# Patient Record
Sex: Male | Born: 1980 | Race: White | Hispanic: No | Smoking: Current every day smoker
Health system: Southern US, Community
[De-identification: ages and names within clinical notes are randomized; demographics above are authoritative.]

## PROBLEM LIST (undated history)

## (undated) DIAGNOSIS — M549 Dorsalgia, unspecified: Secondary | ICD-10-CM

## (undated) DIAGNOSIS — M542 Cervicalgia: Secondary | ICD-10-CM

## (undated) DIAGNOSIS — G8929 Other chronic pain: Secondary | ICD-10-CM

---

## 1999-12-19 ENCOUNTER — Encounter: Payer: Self-pay | Admitting: Internal Medicine

## 1999-12-19 ENCOUNTER — Emergency Department (HOSPITAL_COMMUNITY): Admission: EM | Admit: 1999-12-19 | Discharge: 1999-12-19 | Payer: Self-pay | Admitting: Internal Medicine

## 2002-11-04 ENCOUNTER — Emergency Department (HOSPITAL_COMMUNITY): Admission: EM | Admit: 2002-11-04 | Discharge: 2002-11-04 | Payer: Self-pay

## 2003-09-26 ENCOUNTER — Emergency Department (HOSPITAL_COMMUNITY): Admission: EM | Admit: 2003-09-26 | Discharge: 2003-09-26 | Payer: Self-pay | Admitting: Emergency Medicine

## 2005-07-13 ENCOUNTER — Emergency Department (HOSPITAL_COMMUNITY): Admission: EM | Admit: 2005-07-13 | Discharge: 2005-07-13 | Payer: Self-pay | Admitting: *Deleted

## 2005-09-21 ENCOUNTER — Emergency Department (HOSPITAL_COMMUNITY): Admission: EM | Admit: 2005-09-21 | Discharge: 2005-09-22 | Payer: Self-pay | Admitting: Emergency Medicine

## 2005-11-11 ENCOUNTER — Emergency Department (HOSPITAL_COMMUNITY): Admission: EM | Admit: 2005-11-11 | Discharge: 2005-11-12 | Payer: Self-pay | Admitting: Emergency Medicine

## 2005-11-14 ENCOUNTER — Emergency Department (HOSPITAL_COMMUNITY): Admission: EM | Admit: 2005-11-14 | Discharge: 2005-11-14 | Payer: Self-pay | Admitting: Emergency Medicine

## 2005-12-03 ENCOUNTER — Emergency Department (HOSPITAL_COMMUNITY): Admission: EM | Admit: 2005-12-03 | Discharge: 2005-12-04 | Payer: Self-pay | Admitting: Emergency Medicine

## 2006-01-04 ENCOUNTER — Emergency Department (HOSPITAL_COMMUNITY): Admission: EM | Admit: 2006-01-04 | Discharge: 2006-01-04 | Payer: Self-pay | Admitting: Emergency Medicine

## 2006-01-28 ENCOUNTER — Emergency Department (HOSPITAL_COMMUNITY): Admission: EM | Admit: 2006-01-28 | Discharge: 2006-01-28 | Payer: Self-pay | Admitting: Emergency Medicine

## 2007-02-28 ENCOUNTER — Emergency Department (HOSPITAL_COMMUNITY): Admission: EM | Admit: 2007-02-28 | Discharge: 2007-02-28 | Payer: Self-pay | Admitting: Family Medicine

## 2007-03-21 ENCOUNTER — Emergency Department (HOSPITAL_COMMUNITY): Admission: EM | Admit: 2007-03-21 | Discharge: 2007-03-21 | Payer: Self-pay | Admitting: Family Medicine

## 2007-05-15 ENCOUNTER — Emergency Department (HOSPITAL_COMMUNITY): Admission: EM | Admit: 2007-05-15 | Discharge: 2007-05-15 | Payer: Self-pay | Admitting: Emergency Medicine

## 2007-07-19 ENCOUNTER — Emergency Department (HOSPITAL_COMMUNITY): Admission: EM | Admit: 2007-07-19 | Discharge: 2007-07-19 | Payer: Self-pay | Admitting: Family Medicine

## 2007-09-19 ENCOUNTER — Emergency Department (HOSPITAL_COMMUNITY): Admission: EM | Admit: 2007-09-19 | Discharge: 2007-09-19 | Payer: Self-pay | Admitting: Family Medicine

## 2007-11-09 ENCOUNTER — Emergency Department (HOSPITAL_COMMUNITY): Admission: EM | Admit: 2007-11-09 | Discharge: 2007-11-09 | Payer: Self-pay | Admitting: Emergency Medicine

## 2007-11-10 ENCOUNTER — Inpatient Hospital Stay (HOSPITAL_COMMUNITY): Admission: EM | Admit: 2007-11-10 | Discharge: 2007-11-12 | Payer: Self-pay | Admitting: Emergency Medicine

## 2007-11-17 ENCOUNTER — Emergency Department (HOSPITAL_COMMUNITY): Admission: EM | Admit: 2007-11-17 | Discharge: 2007-11-17 | Payer: Self-pay | Admitting: Emergency Medicine

## 2008-02-17 ENCOUNTER — Emergency Department (HOSPITAL_COMMUNITY): Admission: EM | Admit: 2008-02-17 | Discharge: 2008-02-17 | Payer: Self-pay | Admitting: Emergency Medicine

## 2008-02-17 ENCOUNTER — Emergency Department (HOSPITAL_COMMUNITY): Admission: EM | Admit: 2008-02-17 | Discharge: 2008-02-18 | Payer: Self-pay | Admitting: Emergency Medicine

## 2008-02-19 ENCOUNTER — Emergency Department (HOSPITAL_COMMUNITY): Admission: EM | Admit: 2008-02-19 | Discharge: 2008-02-19 | Payer: Self-pay | Admitting: Emergency Medicine

## 2008-02-21 ENCOUNTER — Emergency Department (HOSPITAL_COMMUNITY): Admission: EM | Admit: 2008-02-21 | Discharge: 2008-02-21 | Payer: Self-pay | Admitting: Emergency Medicine

## 2008-02-24 ENCOUNTER — Emergency Department (HOSPITAL_COMMUNITY): Admission: EM | Admit: 2008-02-24 | Discharge: 2008-02-24 | Payer: Self-pay | Admitting: Emergency Medicine

## 2008-04-07 ENCOUNTER — Emergency Department (HOSPITAL_COMMUNITY): Admission: EM | Admit: 2008-04-07 | Discharge: 2008-04-07 | Payer: Self-pay | Admitting: Emergency Medicine

## 2010-10-14 ENCOUNTER — Emergency Department (HOSPITAL_COMMUNITY)
Admission: EM | Admit: 2010-10-14 | Discharge: 2010-10-14 | Disposition: A | Discharge status: Home / Self Care | Payer: Self-pay | Attending: Emergency Medicine | Admitting: Emergency Medicine

## 2010-10-14 ENCOUNTER — Emergency Department (HOSPITAL_COMMUNITY): Payer: Self-pay

## 2010-10-14 DIAGNOSIS — R42 Dizziness and giddiness: Secondary | ICD-10-CM | POA: Insufficient documentation

## 2010-10-14 DIAGNOSIS — R209 Unspecified disturbances of skin sensation: Secondary | ICD-10-CM | POA: Insufficient documentation

## 2010-10-14 DIAGNOSIS — H532 Diplopia: Secondary | ICD-10-CM | POA: Insufficient documentation

## 2010-10-14 DIAGNOSIS — H538 Other visual disturbances: Secondary | ICD-10-CM | POA: Insufficient documentation

## 2010-10-14 LAB — URINALYSIS, ROUTINE W REFLEX MICROSCOPIC
Glucose, UA: NEGATIVE mg/dL
Ketones, ur: NEGATIVE mg/dL
Nitrite: NEGATIVE
Protein, ur: NEGATIVE mg/dL
Urobilinogen, UA: 0.2 mg/dL (ref 0.0–1.0)
pH: 5.5 (ref 5.0–8.0)

## 2010-10-14 LAB — GLUCOSE, CAPILLARY: Glucose-Capillary: 102 mg/dL — ABNORMAL HIGH (ref 70–99)

## 2010-10-14 LAB — POCT I-STAT, CHEM 8
BUN: 12 mg/dL (ref 6–23)
Glucose, Bld: 114 mg/dL — ABNORMAL HIGH (ref 70–99)
HCT: 35 % — ABNORMAL LOW (ref 39.0–52.0)
Hemoglobin: 11.9 g/dL — ABNORMAL LOW (ref 13.0–17.0)
Potassium: 4.3 mEq/L (ref 3.5–5.1)
Sodium: 138 mEq/L (ref 135–145)

## 2010-10-14 LAB — RAPID URINE DRUG SCREEN, HOSP PERFORMED
Cocaine: NOT DETECTED
Opiates: NOT DETECTED
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
## 2011-04-17 LAB — CBC
HCT: 38.8 — ABNORMAL LOW
HCT: 40.1
HCT: 42.1
Hemoglobin: 13.7
Hemoglobin: 14.2
MCHC: 34
MCHC: 34.4
MCHC: 34.5
MCV: 96.4
Platelets: 208
Platelets: 352
RBC: 4.17 — ABNORMAL LOW
RBC: 4.37
WBC: 6.8
WBC: 7.3

## 2011-04-17 LAB — BASIC METABOLIC PANEL
BUN: 9
CO2: 27
Calcium: 8.5
Chloride: 104
Creatinine, Ser: 0.83
GFR calc non Af Amer: >60
Glucose, Bld: 90
Potassium: 4

## 2011-04-17 LAB — POCT I-STAT, CHEM 8
BUN: 11
Calcium, Ion: 1.11 — ABNORMAL LOW
Creatinine, Ser: 1.2
Hemoglobin: 15.3
Potassium: 4

## 2011-04-17 LAB — DIFFERENTIAL
Basophils Relative: 1
Eosinophils Absolute: 0.1
Lymphs Abs: 2.9
Neutrophils Relative %: 72

## 2011-04-20 LAB — WOUND CULTURE

## 2011-05-04 LAB — POCT RAPID STREP A: Streptococcus, Group A Screen (Direct): NEGATIVE

## 2012-07-27 ENCOUNTER — Emergency Department (HOSPITAL_COMMUNITY)
Admission: EM | Admit: 2012-07-27 | Discharge: 2012-07-27 | Disposition: A | Discharge status: Home / Self Care | Payer: Self-pay | Attending: Emergency Medicine | Admitting: Emergency Medicine

## 2012-07-27 ENCOUNTER — Encounter (HOSPITAL_COMMUNITY): Payer: Self-pay | Admitting: Adult Health

## 2012-07-27 DIAGNOSIS — K089 Disorder of teeth and supporting structures, unspecified: Secondary | ICD-10-CM | POA: Insufficient documentation

## 2012-07-27 DIAGNOSIS — K0381 Cracked tooth: Secondary | ICD-10-CM | POA: Insufficient documentation

## 2012-07-27 DIAGNOSIS — K0889 Other specified disorders of teeth and supporting structures: Secondary | ICD-10-CM

## 2012-07-27 DIAGNOSIS — R51 Headache: Secondary | ICD-10-CM | POA: Insufficient documentation

## 2012-07-27 DIAGNOSIS — S025XXA Fracture of tooth (traumatic), initial encounter for closed fracture: Secondary | ICD-10-CM

## 2012-07-27 DIAGNOSIS — F17200 Nicotine dependence, unspecified, uncomplicated: Secondary | ICD-10-CM | POA: Insufficient documentation

## 2012-07-27 MED ORDER — IBUPROFEN 800 MG PO TABS: 800.0000 mg | ORAL_TABLET | Freq: Three times a day (TID) | ORAL | Status: DC

## 2012-07-27 MED ORDER — HYDROCODONE-ACETAMINOPHEN 5-325 MG PO TABS: 1.0000 | ORAL_TABLET | Freq: Four times a day (QID) | ORAL | Status: DC | PRN

## 2012-07-27 NOTE — ED Provider Notes (Signed)
History   This chart was scribed for Jones Skene, MD by Melba Coon, ED Scribe. The patient was seen in room TR06C/TR06C and the patient's care was started at 7:20PM.    CSN: [id number]  Arrival date & time 07/27/12  1726   None     Chief Complaint  Patient presents with  . Dental Pain    (Consider location/radiation/quality/duration/timing/severity/associated sxs/prior treatment) The history is provided by the patient. No language interpreter was used.   Erik Kelley is a 32 y.o. male who presents to the Emergency Department complaining of constant, moderate to severe, sharp, right lower dental pain and right-sided headache with an onset 4 days ago. He reports spontaneous fracturing of his tooth around onset and has had difficulty with chewing. He has not seen a dentist due to waiting on his dental insurance to kick in. Ibuprofen has not alleviated the pain. He reports fever at home. Denies neck pain, sore throat, rash, back pain, CP, SOB, abdominal pain, nausea, emesis, diarrhea, dysuria, or extremity pain, edema, weakness, numbness, or tingling. No known allergies. No other pertinent medical symptoms.  History reviewed. No pertinent past medical history.  History reviewed. No pertinent past surgical history.  History reviewed. No pertinent family history.  History  Substance Use Topics  . Smoking status: Current Every Day Smoker    Types: Cigarettes  . Smokeless tobacco: Not on file  . Alcohol Use: No      Review of Systems 10 Systems reviewed and all are negative for acute change except as noted in the HPI.   Allergies  Review of patient's allergies indicates no known allergies.  Home Medications   Current Outpatient Rx  Name  Route  Sig  Dispense  Refill  . IBUPROFEN 200 MG PO TABS   Oral   Take 400-800 mg by mouth every 6 (six) hours as needed. For pain           BP 138/90  Pulse 94  Temp 98.8 F (37.1 C) (Oral)  Resp 20  SpO2  98%  Physical Exam  Nursing notes reviewed.  Electronic medical record reviewed. VITAL SIGNS:   Filed Vitals:   07/27/12 1731  BP: 138/90  Pulse: 94  Temp: 98.8 F (37.1 C)  TempSrc: Oral  Resp: 20  SpO2: 98%   CONSTITUTIONAL: Awake, oriented, appears non-toxic HENT: Atraumatic, normocephalic, oral mucosa pink and moist, airway patent. Buccal aspect of #31 broken exposing brown pulp.  #32 erupting. No gingival erythema or pus. Nares patent without drainage. External ears normal. EYES: Conjunctiva clear, EOMI, PERRLA NECK: Trachea midline, non-tender, supple CARDIOVASCULAR: Normal heart rate, Normal rhythm, No murmurs, rubs, gallops PULMONARY/CHEST: Clear to auscultation, no rhonchi, wheezes, or rales. Symmetrical breath sounds. Non-tender. ABDOMINAL: Non-distended, soft, non-tender - no rebound or guarding.  BS normal. NEUROLOGIC: Non-focal, moving all four extremities, no gross sensory or motor deficits. EXTREMITIES: No clubbing, cyanosis, or edema SKIN: Warm, Dry, No erythema, No rash  ED Course  Dental Performed by: Jones Skene Authorized by: Jones Skene Consent: Verbal consent obtained. Consent given by: patient Patient identity confirmed: verbally with patient Local anesthesia used: yes Anesthesia: local infiltration Local anesthetic: bupivacaine 0.5% without epinephrine Anesthetic total: 2.5 ml Patient sedated: no Patient tolerance: Patient tolerated the procedure well with no immediate complications. Comments: Inferior alveolar block placed with good results   (including critical care time)  COORDINATION OF CARE:  7:22PM - pain injection at the site of the dental fracture will be ordered for Erik Kelley.  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 He will be ready for d/c after pain injection is given.    Labs Reviewed - No data to display No results found.   1. Pain, dental   2. Broken tooth       MDM  Erik Kelley is a 32 y.o. male  presents with a broken tooth and tooth pain. Do not think he has an infection - exposed nerve is issue.  Nothing to get epoxy to adhere to - and pt can't get to dentist for a week or more.  Dental block and Pain meds.  No ABx at this time indicated.  I explained the diagnosis and have given explicit precautions to return to the ER including jaw swelling, fever or chills or any other new or worsening symptoms. The patient understands and accepts the medical plan as it's been dictated and I have answered their questions. Discharge instructions concerning home care and prescriptions have been given.  The patient is STABLE and is discharged to home in good condition.         Jones Skene, MD 07/28/12 1233

## 2012-07-27 NOTE — ED Notes (Signed)
Reports bottom back right molar broke off last thursday. Since has been having dental pain and headache. Having difficulty chewing due to pain. Pain is described as sharp.

## 2013-01-20 ENCOUNTER — Emergency Department (HOSPITAL_COMMUNITY): Payer: Self-pay

## 2013-01-20 ENCOUNTER — Encounter (HOSPITAL_COMMUNITY): Payer: Self-pay | Admitting: Emergency Medicine

## 2013-01-20 DIAGNOSIS — S0510XA Contusion of eyeball and orbital tissues, unspecified eye, initial encounter: Secondary | ICD-10-CM | POA: Insufficient documentation

## 2013-01-20 DIAGNOSIS — S0990XA Unspecified injury of head, initial encounter: Secondary | ICD-10-CM | POA: Insufficient documentation

## 2013-01-20 DIAGNOSIS — T7411XA Adult physical abuse, confirmed, initial encounter: Secondary | ICD-10-CM | POA: Insufficient documentation

## 2013-01-20 DIAGNOSIS — F17200 Nicotine dependence, unspecified, uncomplicated: Secondary | ICD-10-CM | POA: Insufficient documentation
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
## 2013-01-21 ENCOUNTER — Emergency Department (HOSPITAL_COMMUNITY)
Admission: EM | Admit: 2013-01-21 | Discharge: 2013-01-21 | Disposition: A | Discharge status: Home / Self Care | Payer: Self-pay | Attending: Emergency Medicine | Admitting: Emergency Medicine

## 2013-01-21 ENCOUNTER — Encounter (HOSPITAL_COMMUNITY): Payer: Self-pay | Admitting: Radiology

## 2013-01-21 ENCOUNTER — Emergency Department (HOSPITAL_COMMUNITY): Payer: Self-pay

## 2013-01-21 DIAGNOSIS — S0512XA Contusion of eyeball and orbital tissues, left eye, initial encounter: Secondary | ICD-10-CM

## 2013-01-21 MED ORDER — OXYCODONE-ACETAMINOPHEN 5-325 MG PO TABS: ORAL_TABLET | ORAL | Status: DC

## 2013-01-21 MED ORDER — FLUORESCEIN SODIUM 1 MG OP STRP
1.0000 | ORAL_STRIP | Freq: Once | OPHTHALMIC | Status: AC
  Administered 2013-01-21: 1 via OPHTHALMIC
  Filled 2013-01-21: qty 1

## 2013-01-21 MED ORDER — OXYCODONE-ACETAMINOPHEN 5-325 MG PO TABS
2.0000 | ORAL_TABLET | Freq: Once | ORAL | Status: AC
  Administered 2013-01-21: 2 via ORAL
  Filled 2013-01-21: qty 2
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
## 2013-12-29 ENCOUNTER — Encounter (HOSPITAL_COMMUNITY): Payer: Self-pay | Admitting: Emergency Medicine

## 2013-12-29 ENCOUNTER — Emergency Department (HOSPITAL_COMMUNITY)
Admission: EM | Admit: 2013-12-29 | Discharge: 2013-12-29 | Disposition: A | Discharge status: Home / Self Care | Payer: BC Managed Care – PPO | Attending: Emergency Medicine | Admitting: Emergency Medicine

## 2013-12-29 ENCOUNTER — Emergency Department (HOSPITAL_COMMUNITY): Payer: BC Managed Care – PPO

## 2013-12-29 DIAGNOSIS — Y929 Unspecified place or not applicable: Secondary | ICD-10-CM | POA: Insufficient documentation

## 2013-12-29 DIAGNOSIS — X500XXA Overexertion from strenuous movement or load, initial encounter: Secondary | ICD-10-CM | POA: Insufficient documentation

## 2013-12-29 DIAGNOSIS — F17200 Nicotine dependence, unspecified, uncomplicated: Secondary | ICD-10-CM | POA: Insufficient documentation

## 2013-12-29 DIAGNOSIS — S92501A Displaced unspecified fracture of right lesser toe(s), initial encounter for closed fracture: Secondary | ICD-10-CM

## 2013-12-29 DIAGNOSIS — S92919A Unspecified fracture of unspecified toe(s), initial encounter for closed fracture: Secondary | ICD-10-CM | POA: Insufficient documentation

## 2013-12-29 DIAGNOSIS — Y9389 Activity, other specified: Secondary | ICD-10-CM | POA: Insufficient documentation

## 2013-12-29 MED ORDER — IBUPROFEN 400 MG PO TABS
800.0000 mg | ORAL_TABLET | Freq: Once | ORAL | Status: AC
  Administered 2013-12-29: 800 mg via ORAL
  Filled 2013-12-29: qty 2

## 2013-12-29 MED ORDER — TRAMADOL HCL 50 MG PO TABS: 50.0000 mg | ORAL_TABLET | Freq: Four times a day (QID) | ORAL | Status: DC | PRN

## 2013-12-29 NOTE — ED Provider Notes (Signed)
CSN: [id number]     Arrival date & time 12/29/13  1715 History   First MD Initiated Contact with Patient 12/29/13 1842     This chart was scribed for non-physician practitioner, Wynetta Emery  PA-C working with Audree Camel, MD by Arlan Organ, ED Scribe. This patient was seen in room TR11C/TR11C and the patient's care was started at 7:31 PM.   Chief Complaint  Patient presents with  . Toe Injury   HPI  HPI Comments: Erik Kelley is a 33 y.o. male who presents to the Emergency Department complaining of constant, moderate R 5th toe pain x 1 day that is unchanged. He has also noted some swelling to the toe. Pt states he was getting out of bed and inverted his toe while stepping on his dog step. Currently he rates his pain 5/10 without palpation. He states this pain is exacerbated with ambulation, weight bearing, and palpation. No alleviating factors. At this time she denies any fever, chills, numbness, loss of sensation, or paresthesia. No known allergies to medications. He has no pertinent past medical history. No other concerns this visit.  History reviewed. No pertinent past medical history. History reviewed. No pertinent past surgical history. History reviewed. No pertinent family history. History  Substance Use Topics  . Smoking status: Current Every Day Smoker    Types: Cigarettes  . Smokeless tobacco: Not on file  . Alcohol Use: No    Review of Systems  Constitutional: Negative for fever and chills.  HENT: Negative for congestion.   Eyes: Negative for redness.  Respiratory: Negative for cough.   Musculoskeletal: Positive for arthralgias (R 5th toe).  Skin: Negative for rash.  Neurological: Negative for weakness and numbness.  Psychiatric/Behavioral: Negative for confusion.      Allergies  Review of patient's allergies indicates no known allergies.  Home Medications   Prior to Admission medications   Medication Sig Start Date End Date Taking? Authorizing  Provider  traMADol (ULTRAM) 50 MG tablet Take 1 tablet (50 mg total) by mouth every 6 (six) hours as needed. 12/29/13   Ziv Welchel, PA-C   Triage Vitals: BP 139/93  Pulse 80  Temp(Src) 98.5 F (36.9 C) (Oral)  Resp 18  SpO2 98%   Physical Exam  Nursing note and vitals reviewed. Constitutional: He is oriented to person, place, and time. He appears well-developed and well-nourished. No distress.  HENT:  Head: Normocephalic.  Eyes: Conjunctivae and EOM are normal.  Cardiovascular: Normal rate.   Pulmonary/Chest: Effort normal. No stridor.  Musculoskeletal: Normal range of motion.  Right fifth toe with ecchymoses, swelling and tenderness to palpation, there is no overlying skin changes. Sensation is intact. Reduced range of motion  Neurological: He is alert and oriented to person, place, and time.  Psychiatric: He has a normal mood and affect.    ED Course  Procedures (including critical care time)  DIAGNOSTIC STUDIES: Oxygen Saturation is 100% on RA, Normal by my interpretation.    COORDINATION OF CARE: 8:29 PM-Discussed treatment plan with pt at bedside and pt agreed to plan.     Labs Review Labs Reviewed - No data to display  Imaging Review Dg Toe 5th Right  12/29/2013   CLINICAL DATA:  Stubbed toe, pain.  EXAM: RIGHT FIFTH TOE  COMPARISON:  None.  FINDINGS: There is a nondisplaced transverse fracture across the middle phalanx of the fifth toe seen best on the lateral view. The DIP joint is fused. Soft tissue swelling is present. No dislocation.  IMPRESSION: Fracture.   Electronically Signed   By: Davonna BellingJohn  Curnes M.D.   On: 12/29/2013 18:50     EKG Interpretation None      MDM   Final diagnoses:  Fracture of fifth toe, right, closed    Filed Vitals:   12/29/13 1728 12/29/13 1936  BP: 137/92 139/93  Pulse: 87 80  Temp: 98.8 F (37.1 C) 98.5 F (36.9 C)  TempSrc: Oral Oral  Resp: 20 18  SpO2: 100% 98%    Medications  ibuprofen (ADVIL,MOTRIN) tablet 800 mg  (800 mg Oral Given 12/29/13 1946)    Erik Kelley is a 33 y.o. male presenting with right small toe pain. X-ray shows fracture. Buddy tape, postop boot and crutches given. Patient advised rest, elevation and ice.  Evaluation does not show pathology that would require ongoing emergent intervention or inpatient treatment. Pt is hemodynamically stable and mentating appropriately. Discussed findings and plan with patient/guardian, who agrees with care plan. All questions answered. Return precautions discussed and outpatient follow up given.   Discharge Medication List as of 12/29/2013  7:34 PM    START taking these medications   Details  traMADol (ULTRAM) 50 MG tablet Take 1 tablet (50 mg total) by mouth every 6 (six) hours as needed., Starting 12/29/2013, Until Discontinued, Print        I personally performed the services described in this documentation, which was scribed in my presence. The recorded information has been reviewed and is accurate.    Wynetta Emeryicole Norm Wray, PA-C 12/29/13 2030
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
## 2013-12-29 NOTE — ED Notes (Signed)
Pt. Stated, i had a step beside my bed for my dogs and I stepped down and hit my rt. Little toe, it hurts to even step down or put any pressure on it.  It hurts all the way my foot.

## 2013-12-29 NOTE — Discharge Instructions (Signed)
Rest, Ice intermittently (in the first 24-48 hours), Gentle compression with an Ace wrap, and elevate (Limb above the level of the heart) °  °Take up to 800mg of ibuprofen (that is usually 4 over the counter pills)  3 times a day for 5 days. Take with food. ° ° For breakthrough pain you may take Tramadol. Do not drink alcohol drive or operate heavy machinery when taking Tramadol. ° °Do not hesitate to return to the Emergency Department for any new, worsening or concerning symptoms.  ° °If you do not have a primary care doctor you can establish one at the  ° °[facility]: °[address] °[place] [address] °[phone] ° °After you establish care. Let them know you were seen in the emergency room. They must obtain records for further management.  ° ° ° °

## 2013-12-30 NOTE — ED Provider Notes (Signed)
Medical screening examination/treatment/procedure(s) were performed by non-physician practitioner and as supervising physician I was immediately available for consultation/collaboration.   EKG Interpretation None        Audree Camel, MD 12/30/13 [phone]

## 2015-02-12 ENCOUNTER — Encounter (HOSPITAL_COMMUNITY): Payer: Self-pay

## 2015-02-12 ENCOUNTER — Emergency Department (HOSPITAL_COMMUNITY)
Admission: EM | Admit: 2015-02-12 | Discharge: 2015-02-12 | Disposition: A | Discharge status: Home / Self Care | Payer: No Typology Code available for payment source | Attending: Emergency Medicine | Admitting: Emergency Medicine

## 2015-02-12 ENCOUNTER — Emergency Department (HOSPITAL_COMMUNITY): Payer: No Typology Code available for payment source

## 2015-02-12 DIAGNOSIS — K029 Dental caries, unspecified: Secondary | ICD-10-CM | POA: Insufficient documentation

## 2015-02-12 DIAGNOSIS — S59901A Unspecified injury of right elbow, initial encounter: Secondary | ICD-10-CM | POA: Diagnosis not present

## 2015-02-12 DIAGNOSIS — S0990XA Unspecified injury of head, initial encounter: Secondary | ICD-10-CM | POA: Diagnosis present

## 2015-02-12 DIAGNOSIS — Z720 Tobacco use: Secondary | ICD-10-CM | POA: Insufficient documentation

## 2015-02-12 DIAGNOSIS — S4991XA Unspecified injury of right shoulder and upper arm, initial encounter: Secondary | ICD-10-CM | POA: Insufficient documentation

## 2015-02-12 DIAGNOSIS — Y998 Other external cause status: Secondary | ICD-10-CM | POA: Insufficient documentation

## 2015-02-12 DIAGNOSIS — Y92410 Unspecified street and highway as the place of occurrence of the external cause: Secondary | ICD-10-CM | POA: Diagnosis not present

## 2015-02-12 DIAGNOSIS — Y9389 Activity, other specified: Secondary | ICD-10-CM | POA: Diagnosis not present

## 2015-02-12 DIAGNOSIS — G8929 Other chronic pain: Secondary | ICD-10-CM | POA: Diagnosis not present

## 2015-02-12 DIAGNOSIS — S92345A Nondisplaced fracture of fourth metatarsal bone, left foot, initial encounter for closed fracture: Secondary | ICD-10-CM | POA: Insufficient documentation

## 2015-02-12 DIAGNOSIS — S30811A Abrasion of abdominal wall, initial encounter: Secondary | ICD-10-CM | POA: Insufficient documentation

## 2015-02-12 DIAGNOSIS — S0083XA Contusion of other part of head, initial encounter: Secondary | ICD-10-CM | POA: Insufficient documentation

## 2015-02-12 DIAGNOSIS — S92302A Fracture of unspecified metatarsal bone(s), left foot, initial encounter for closed fracture: Secondary | ICD-10-CM

## 2015-02-12 DIAGNOSIS — T148XXA Other injury of unspecified body region, initial encounter: Secondary | ICD-10-CM

## 2015-02-12 HISTORY — DX: Cervicalgia: M54.2

## 2015-02-12 HISTORY — DX: Dorsalgia, unspecified: M54.9

## 2015-02-12 HISTORY — DX: Other chronic pain: G89.29

## 2015-02-12 MED ORDER — MORPHINE SULFATE 4 MG/ML IJ SOLN
4.0000 mg | Freq: Once | INTRAMUSCULAR | Status: AC
  Administered 2015-02-12: 4 mg via INTRAVENOUS
  Filled 2015-02-12: qty 1

## 2015-02-12 MED ORDER — HYDROCODONE-ACETAMINOPHEN 5-325 MG PO TABS: 1.0000 | ORAL_TABLET | ORAL | Status: DC | PRN

## 2015-02-12 MED ORDER — ONDANSETRON HCL 4 MG/2ML IJ SOLN
4.0000 mg | Freq: Once | INTRAMUSCULAR | Status: AC
  Administered 2015-02-12: 4 mg via INTRAVENOUS
  Filled 2015-02-12: qty 2

## 2015-02-12 MED ORDER — HYDROMORPHONE HCL 1 MG/ML IJ SOLN
1.0000 mg | Freq: Once | INTRAMUSCULAR | Status: AC
  Administered 2015-02-12: 1 mg via INTRAVENOUS
  Filled 2015-02-12: qty 1

## 2015-02-12 NOTE — ED Notes (Signed)
Pt refused to update vitals

## 2015-02-12 NOTE — ED Notes (Signed)
Patient transported to X-ray 

## 2015-02-12 NOTE — ED Notes (Signed)
C-collar applied-C spine maintained

## 2015-02-12 NOTE — ED Notes (Signed)
Ortho tech at bedside 
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
## 2015-02-12 NOTE — Progress Notes (Signed)
Orthopedic Tech Progress Note Patient Details:  Erik Kelley [birth year] [id number]  Ortho Devices Type of Ortho Device: Ace wrap, Crutches, Post (short leg) splint Ortho Device/Splint Location: lle Ortho Device/Splint Interventions: Application   [name] 02/12/2015, 11:09 AM
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
## 2015-02-12 NOTE — ED Notes (Signed)
Attempted to perform wound care on pt.  Was able to clean wounds to left arm but pt was not tolerating procedure well.  RN to give pt a break and re attempt in a few minutes.

## 2015-02-12 NOTE — ED Notes (Signed)
Wounds cleaned, bacitracin applied and clean dressings applied.  Pt voiced concern of left pinky finger.  Pinky finger is now swollen and bruised with limited movement.  Cap refill remains <3 seconds.  [place] MD made aware and further orders received.

## 2015-03-12 ENCOUNTER — Emergency Department (HOSPITAL_COMMUNITY): Payer: Self-pay

## 2015-03-12 ENCOUNTER — Encounter (HOSPITAL_COMMUNITY)
Admission: EM | Disposition: A | Discharge status: Home / Self Care | Payer: Self-pay | Source: Home / Self Care | Attending: Orthopedic Surgery

## 2015-03-12 ENCOUNTER — Inpatient Hospital Stay (HOSPITAL_COMMUNITY)
Admission: EM | Admit: 2015-03-12 | Discharge: 2015-03-18 | DRG: 504 | Disposition: A | Discharge status: Home / Self Care | Payer: Self-pay | Attending: Orthopedic Surgery | Admitting: Orthopedic Surgery

## 2015-03-12 ENCOUNTER — Encounter (HOSPITAL_COMMUNITY): Payer: Self-pay | Admitting: *Deleted

## 2015-03-12 ENCOUNTER — Emergency Department (HOSPITAL_COMMUNITY): Payer: Self-pay | Admitting: Anesthesiology

## 2015-03-12 DIAGNOSIS — S93326A Dislocation of tarsometatarsal joint of unspecified foot, initial encounter: Secondary | ICD-10-CM | POA: Diagnosis present

## 2015-03-12 DIAGNOSIS — M549 Dorsalgia, unspecified: Secondary | ICD-10-CM | POA: Diagnosis present

## 2015-03-12 DIAGNOSIS — S52371A Galeazzi's fracture of right radius, initial encounter for closed fracture: Secondary | ICD-10-CM

## 2015-03-12 DIAGNOSIS — G8929 Other chronic pain: Secondary | ICD-10-CM | POA: Diagnosis present

## 2015-03-12 DIAGNOSIS — F17210 Nicotine dependence, cigarettes, uncomplicated: Secondary | ICD-10-CM | POA: Diagnosis present

## 2015-03-12 DIAGNOSIS — G5601 Carpal tunnel syndrome, right upper limb: Secondary | ICD-10-CM | POA: Diagnosis present

## 2015-03-12 DIAGNOSIS — T07XXXA Unspecified multiple injuries, initial encounter: Secondary | ICD-10-CM

## 2015-03-12 DIAGNOSIS — S93325A Dislocation of tarsometatarsal joint of left foot, initial encounter: Secondary | ICD-10-CM

## 2015-03-12 DIAGNOSIS — S52501A Unspecified fracture of the lower end of right radius, initial encounter for closed fracture: Secondary | ICD-10-CM | POA: Diagnosis present

## 2015-03-12 DIAGNOSIS — S92242A Displaced fracture of medial cuneiform of left foot, initial encounter for closed fracture: Principal | ICD-10-CM | POA: Diagnosis present

## 2015-03-12 DIAGNOSIS — S92302A Fracture of unspecified metatarsal bone(s), left foot, initial encounter for closed fracture: Secondary | ICD-10-CM

## 2015-03-12 DIAGNOSIS — S62001A Unspecified fracture of navicular [scaphoid] bone of right wrist, initial encounter for closed fracture: Secondary | ICD-10-CM | POA: Diagnosis present

## 2015-03-12 HISTORY — PX: OPEN REDUCTION INTERNAL FIXATION (ORIF) DISTAL RADIAL FRACTURE: SHX5989

## 2015-03-12 SURGERY — OPEN REDUCTION INTERNAL FIXATION (ORIF) DISTAL RADIUS FRACTURE
Anesthesia: General | Site: Arm Lower | Laterality: Right

## 2015-03-12 MED ORDER — MEPERIDINE HCL 25 MG/ML IJ SOLN: 6.2500 mg | INTRAMUSCULAR | Status: DC | PRN

## 2015-03-12 MED ORDER — HYDROMORPHONE HCL 1 MG/ML IJ SOLN
1.0000 mg | Freq: Once | INTRAMUSCULAR | Status: AC
  Administered 2015-03-12: 1 mg via INTRAVENOUS
  Filled 2015-03-12: qty 1

## 2015-03-12 MED ORDER — HYDROMORPHONE HCL 1 MG/ML IJ SOLN
INTRAMUSCULAR | Status: AC
  Filled 2015-03-12: qty 1

## 2015-03-12 MED ORDER — ONDANSETRON HCL 4 MG/2ML IJ SOLN
4.0000 mg | Freq: Once | INTRAMUSCULAR | Status: AC
  Administered 2015-03-12: 4 mg via INTRAVENOUS
  Filled 2015-03-12: qty 2

## 2015-03-12 MED ORDER — DIPHENHYDRAMINE HCL 50 MG/ML IJ SOLN
INTRAMUSCULAR | Status: DC | PRN
  Administered 2015-03-12: 25 mg via INTRAVENOUS

## 2015-03-12 MED ORDER — BUPIVACAINE-EPINEPHRINE (PF) 0.5% -1:200000 IJ SOLN
INTRAMUSCULAR | Status: DC | PRN
  Administered 2015-03-12: 20 mL via PERINEURAL

## 2015-03-12 MED ORDER — MIDAZOLAM HCL 2 MG/2ML IJ SOLN
INTRAMUSCULAR | Status: AC
  Filled 2015-03-12: qty 4

## 2015-03-12 MED ORDER — SUCCINYLCHOLINE CHLORIDE 20 MG/ML IJ SOLN
INTRAMUSCULAR | Status: AC
  Filled 2015-03-12: qty 1

## 2015-03-12 MED ORDER — METHOCARBAMOL 1000 MG/10ML IJ SOLN
500.0000 mg | Freq: Four times a day (QID) | INTRAVENOUS | Status: DC | PRN
  Filled 2015-03-12: qty 5

## 2015-03-12 MED ORDER — POTASSIUM CHLORIDE IN NACL 20-0.45 MEQ/L-% IV SOLN
INTRAVENOUS | Status: DC
  Administered 2015-03-13 – 2015-03-15 (×4): via INTRAVENOUS
  Filled 2015-03-12 (×12): qty 1000

## 2015-03-12 MED ORDER — LACTATED RINGERS IV SOLN: INTRAVENOUS | Status: DC

## 2015-03-12 MED ORDER — CEFAZOLIN SODIUM-DEXTROSE 2-3 GM-% IV SOLR
INTRAVENOUS | Status: AC
  Filled 2015-03-12: qty 50

## 2015-03-12 MED ORDER — EPHEDRINE SULFATE 50 MG/ML IJ SOLN
INTRAMUSCULAR | Status: AC
  Filled 2015-03-12: qty 1

## 2015-03-12 MED ORDER — 0.9 % SODIUM CHLORIDE (POUR BTL) OPTIME
TOPICAL | Status: DC | PRN
  Administered 2015-03-12: 1000 mL

## 2015-03-12 MED ORDER — LIDOCAINE HCL (CARDIAC) 20 MG/ML IV SOLN
INTRAVENOUS | Status: AC
  Filled 2015-03-12: qty 10

## 2015-03-12 MED ORDER — FENTANYL CITRATE (PF) 250 MCG/5ML IJ SOLN
INTRAMUSCULAR | Status: AC
  Filled 2015-03-12: qty 5

## 2015-03-12 MED ORDER — LIDOCAINE HCL (CARDIAC) 20 MG/ML IV SOLN
INTRAVENOUS | Status: DC | PRN
  Administered 2015-03-12: 80 mg via INTRAVENOUS

## 2015-03-12 MED ORDER — LACTATED RINGERS IV SOLN
INTRAVENOUS | Status: DC | PRN
  Administered 2015-03-12 (×2): via INTRAVENOUS

## 2015-03-12 MED ORDER — METHOCARBAMOL 500 MG PO TABS
500.0000 mg | ORAL_TABLET | Freq: Four times a day (QID) | ORAL | Status: DC | PRN
  Administered 2015-03-13 – 2015-03-16 (×7): 500 mg via ORAL
  Filled 2015-03-12 (×12): qty 1

## 2015-03-12 MED ORDER — HYDROMORPHONE HCL 1 MG/ML IJ SOLN
1.0000 mg | INTRAMUSCULAR | Status: DC | PRN
  Administered 2015-03-12 – 2015-03-17 (×8): 1 mg via INTRAVENOUS
  Filled 2015-03-12 (×13): qty 1

## 2015-03-12 MED ORDER — ONDANSETRON HCL 4 MG/2ML IJ SOLN
INTRAMUSCULAR | Status: DC | PRN
  Administered 2015-03-12: 4 mg via INTRAVENOUS

## 2015-03-12 MED ORDER — MIDAZOLAM HCL 5 MG/5ML IJ SOLN
INTRAMUSCULAR | Status: DC | PRN
  Administered 2015-03-12: 2 mg via INTRAVENOUS

## 2015-03-12 MED ORDER — DIPHENHYDRAMINE HCL 50 MG/ML IJ SOLN
INTRAMUSCULAR | Status: AC
  Filled 2015-03-12: qty 1

## 2015-03-12 MED ORDER — PROMETHAZINE HCL 25 MG/ML IJ SOLN: 6.2500 mg | INTRAMUSCULAR | Status: DC | PRN

## 2015-03-12 MED ORDER — ROCURONIUM BROMIDE 50 MG/5ML IV SOLN
INTRAVENOUS | Status: AC
  Filled 2015-03-12: qty 2

## 2015-03-12 MED ORDER — HYDROMORPHONE HCL 1 MG/ML IJ SOLN
0.2500 mg | INTRAMUSCULAR | Status: DC | PRN
  Administered 2015-03-12 (×2): 0.5 mg via INTRAVENOUS

## 2015-03-12 MED ORDER — FENTANYL CITRATE (PF) 250 MCG/5ML IJ SOLN
INTRAMUSCULAR | Status: DC | PRN
  Administered 2015-03-12: 100 ug via INTRAVENOUS
  Administered 2015-03-12 (×3): 50 ug via INTRAVENOUS

## 2015-03-12 MED ORDER — ONDANSETRON HCL 4 MG/2ML IJ SOLN
INTRAMUSCULAR | Status: AC
  Filled 2015-03-12: qty 2

## 2015-03-12 MED ORDER — SODIUM CHLORIDE 0.9 % IV BOLUS (SEPSIS)
1000.0000 mL | Freq: Once | INTRAVENOUS | Status: AC
  Administered 2015-03-12: 1000 mL via INTRAVENOUS

## 2015-03-12 MED ORDER — PROPOFOL 10 MG/ML IV BOLUS
INTRAVENOUS | Status: DC | PRN
  Administered 2015-03-12: 160 mg via INTRAVENOUS

## 2015-03-12 MED ORDER — PROPOFOL 10 MG/ML IV BOLUS
INTRAVENOUS | Status: AC
  Filled 2015-03-12: qty 20

## 2015-03-12 MED ORDER — ARTIFICIAL TEARS OP OINT
TOPICAL_OINTMENT | OPHTHALMIC | Status: AC
  Filled 2015-03-12: qty 3.5

## 2015-03-12 MED ORDER — CEFAZOLIN SODIUM-DEXTROSE 2-3 GM-% IV SOLR
INTRAVENOUS | Status: DC | PRN
  Administered 2015-03-12: 2 g via INTRAVENOUS

## 2015-03-12 MED ORDER — SUCCINYLCHOLINE CHLORIDE 20 MG/ML IJ SOLN
INTRAMUSCULAR | Status: DC | PRN
  Administered 2015-03-12: 120 mg via INTRAVENOUS

## 2015-03-12 SURGICAL SUPPLY — 68 items
APL SKNCLS STERI-STRIP NONHPOA (GAUZE/BANDAGES/DRESSINGS) ×1
BANDAGE ELASTIC 3 VELCRO ST LF (GAUZE/BANDAGES/DRESSINGS) ×2 IMPLANT
BANDAGE ELASTIC 4 VELCRO ST LF (GAUZE/BANDAGES/DRESSINGS) ×2 IMPLANT
BANDAGE ELASTIC 6 VELCRO ST LF (GAUZE/BANDAGES/DRESSINGS) ×1 IMPLANT
BENZOIN TINCTURE PRP APPL 2/3 (GAUZE/BANDAGES/DRESSINGS) ×1 IMPLANT
BIT DRILL 2 FAST STEP (BIT) ×1 IMPLANT
BIT DRILL 2.2 CANN STRGHT (BIT) ×1 IMPLANT
BIT DRILL 2.5X4 QC (BIT) ×1 IMPLANT
BNDG CMPR 9X4 STRL LF SNTH (GAUZE/BANDAGES/DRESSINGS) ×1
BNDG ESMARK 4X9 LF (GAUZE/BANDAGES/DRESSINGS) ×2 IMPLANT
BNDG GAUZE ELAST 4 BULKY (GAUZE/BANDAGES/DRESSINGS) ×4 IMPLANT
CLSR STERI-STRIP ANTIMIC 1/2X4 (GAUZE/BANDAGES/DRESSINGS) ×1 IMPLANT
CORDS BIPOLAR (ELECTRODE) ×1 IMPLANT
COVER SURGICAL LIGHT HANDLE (MISCELLANEOUS) ×2 IMPLANT
CUFF TOURNIQUET SINGLE 18IN (TOURNIQUET CUFF) ×2 IMPLANT
DRAPE OEC MINIVIEW 54X84 (DRAPES) ×1 IMPLANT
DRAPE SURG 17X23 STRL (DRAPES) ×2 IMPLANT
DRSG PAD ABDOMINAL 8X10 ST (GAUZE/BANDAGES/DRESSINGS) ×1 IMPLANT
DURAPREP 26ML APPLICATOR (WOUND CARE) ×2 IMPLANT
ELECT REM PT RETURN 9FT ADLT (ELECTROSURGICAL)
ELECTRODE REM PT RTRN 9FT ADLT (ELECTROSURGICAL) IMPLANT
GAUZE SPONGE 4X4 12PLY STRL (GAUZE/BANDAGES/DRESSINGS) ×2 IMPLANT
GAUZE XEROFORM 5X9 LF (GAUZE/BANDAGES/DRESSINGS) ×1 IMPLANT
GLOVE SURG SYN 8.0 (GLOVE) ×2 IMPLANT
GLOVE SURG SYN 8.0 PF PI (GLOVE) ×1 IMPLANT
GOWN STRL REUS W/ TWL LRG LVL3 (GOWN DISPOSABLE) ×1 IMPLANT
GOWN STRL REUS W/ TWL XL LVL3 (GOWN DISPOSABLE) ×1 IMPLANT
GOWN STRL REUS W/TWL LRG LVL3 (GOWN DISPOSABLE) ×2
GOWN STRL REUS W/TWL XL LVL3 (GOWN DISPOSABLE) ×2
GUIDEWIRE .045XTROC TIP LSR LN (WIRE) IMPLANT
K-WIRE 1.1 (WIRE) ×2
KIT BASIN OR (CUSTOM PROCEDURE TRAY) ×2 IMPLANT
KIT ROOM TURNOVER OR (KITS) ×2 IMPLANT
MANIFOLD NEPTUNE II (INSTRUMENTS) ×2 IMPLANT
NDL HYPO 25GX1X1/2 BEV (NEEDLE) IMPLANT
NEEDLE HYPO 25GX1X1/2 BEV (NEEDLE) IMPLANT
NS IRRIG 1000ML POUR BTL (IV SOLUTION) ×2 IMPLANT
PACK ORTHO EXTREMITY (CUSTOM PROCEDURE TRAY) ×2 IMPLANT
PAD ARMBOARD 7.5X6 YLW CONV (MISCELLANEOUS) ×4 IMPLANT
PAD CAST 3X4 CTTN HI CHSV (CAST SUPPLIES) ×1 IMPLANT
PAD CAST 4YDX4 CTTN HI CHSV (CAST SUPPLIES) ×1 IMPLANT
PADDING CAST COTTON 3X4 STRL (CAST SUPPLIES) ×2
PADDING CAST COTTON 4X4 STRL (CAST SUPPLIES) ×2
PEG SUBCHONDRAL SMOOTH 2.0X22 (Peg) ×3 IMPLANT
PEG SUBCHONDRAL SMOOTH 2.0X24 (Peg) ×4 IMPLANT
PENCIL BUTTON HOLSTER BLD 10FT (ELECTRODE) IMPLANT
PLATE STAN 24.4X59.5 RT (Plate) ×1 IMPLANT
SCREW BN 12X3.5XNS CORT TI (Screw) IMPLANT
SCREW COMPRESSION 3.5X20 (Screw) ×1 IMPLANT
SCREW CORT 3.5X12 (Screw) ×2 IMPLANT
SCREW CORT 3.5X14 LNG (Screw) ×1 IMPLANT
SCREW CORT 3.5X15 (Screw) ×1 IMPLANT
SCREW CORT 3.5X16 LNG (Screw) ×1 IMPLANT
SPLINT PLASTER EXTRA FAST 3X15 (CAST SUPPLIES) ×1
SPLINT PLASTER GYPS XFAST 3X15 (CAST SUPPLIES) IMPLANT
SPONGE LAP 4X18 X RAY DECT (DISPOSABLE) IMPLANT
SPONGE SCRUB IODOPHOR (GAUZE/BANDAGES/DRESSINGS) ×2 IMPLANT
STRIP CLOSURE SKIN 1/2X4 (GAUZE/BANDAGES/DRESSINGS) ×2 IMPLANT
SUT PROLENE 3 0 PS 2 (SUTURE) ×1 IMPLANT
SUT VIC AB 2-0 CT3 27 (SUTURE) ×1 IMPLANT
SUT VIC AB 3-0 FS2 27 (SUTURE) IMPLANT
SUT VICRYL 4-0 PS2 18IN ABS (SUTURE) ×1 IMPLANT
SYR CONTROL 10ML LL (SYRINGE) IMPLANT
TOWEL OR 17X24 6PK STRL BLUE (TOWEL DISPOSABLE) ×2 IMPLANT
TOWEL OR 17X26 10 PK STRL BLUE (TOWEL DISPOSABLE) ×2 IMPLANT
TUBE CONNECTING 12X1/4 (SUCTIONS) IMPLANT
UNDERPAD 30X30 INCONTINENT (UNDERPADS AND DIAPERS) ×2 IMPLANT
WATER STERILE IRR 1000ML POUR (IV SOLUTION) ×2 IMPLANT

## 2015-03-12 NOTE — Progress Notes (Signed)
Orthopedic Tech Progress Note Patient Details:  Erik Kelley 1981-06-10 [id number]  Ortho Devices Type of Ortho Device: Ace wrap, Sugartong splint Ortho Device/Splint Location: RUE Ortho Device/Splint Interventions: Ordered, Application   Jennye Moccasin 03/12/2015, 7:44 PM
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
## 2015-03-12 NOTE — ED Provider Notes (Signed)
Imaging returns and patient has a Lisfranc dislocation of the left foot with accompanying fractures that are closed and neurovascularly intact as well as a fracture of the right radius. Spoke with Dr. Mina Marble and Dr. Dion Saucier who evaluated the images and feel that the patient will need surgical repair. Patient has been fasting since 8 AM this morning. After Dion Saucier will come and evaluate because most likely patient will be admitted for repair tomorrow. Sugar tong splint was placed on the right forearm  Gwyneth Sprout, MD 03/13/15 0021
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
## 2015-03-12 NOTE — Anesthesia Preprocedure Evaluation (Addendum)
Anesthesia Evaluation  Patient identified by MRN, date of birth, ID band Patient awake    Reviewed: Allergy & Precautions, NPO status , Patient's Chart, lab work & pertinent test results  Airway Mallampati: I  TM Distance: >3 FB Neck ROM: Full    Dental  (+) Teeth Intact, Poor Dentition, Dental Advisory Given   Pulmonary Current Smoker,  + rhonchi         Cardiovascular negative cardio ROS  Rhythm:Regular Rate:Normal     Neuro/Psych negative neurological ROS  negative psych ROS   GI/Hepatic negative GI ROS, Neg liver ROS,   Endo/Other  negative endocrine ROS  Renal/GU negative Renal ROS  negative genitourinary   Musculoskeletal negative musculoskeletal ROS (+)   Abdominal   Peds negative pediatric ROS (+)  Hematology negative hematology ROS (+)   Anesthesia Other Findings   Reproductive/Obstetrics negative OB ROS                           Anesthesia Physical Anesthesia Plan  ASA: I and emergent  Anesthesia Plan: General   Post-op Pain Management:    Induction: Intravenous, Rapid sequence and Cricoid pressure planned  Airway Management Planned: Oral ETT  Additional Equipment:   Intra-op Plan:   Post-operative Plan: Extubation in OR  Informed Consent: I have reviewed the patients History and Physical, chart, labs and discussed the procedure including the risks, benefits and alternatives for the proposed anesthesia with the patient or authorized representative who has indicated his/her understanding and acceptance.   Dental advisory given  Plan Discussed with: CRNA, Anesthesiologist and Surgeon  Anesthesia Plan Comments:        Anesthesia Quick Evaluation

## 2015-03-12 NOTE — ED Notes (Signed)
Ortho Tech has been notified. 1915

## 2015-03-12 NOTE — ED Notes (Signed)
Mother and family taking home belongings

## 2015-03-12 NOTE — Transfer of Care (Signed)
Immediate Anesthesia Transfer of Care Note  Patient: Erik Kelley  Procedure(s) Performed: Procedure(s): OPEN REDUCTION INTERNAL FIXATION (ORIF) RIGHT DISTAL RADIAL AND SCAPHOID FRACTURES AND CARPAL TUNNEL RELEASE, LEFT FOOT SPLINT  APPLICATION. (Right)  Patient Location: PACU  Anesthesia Type:GA combined with regional for post-op pain  Level of Consciousness: sedated  Airway & Oxygen Therapy: Patient Spontanous Breathing and Patient connected to face mask oxygen  Post-op Assessment: Report given to RN and Post -op Vital signs reviewed and stable  Post vital signs: Reviewed and stable  Last Vitals:  Filed Vitals:   03/12/15 1930  BP: 136/86  Pulse: 106  Temp:   Resp: 12    Complications: No apparent anesthesia complications

## 2015-03-12 NOTE — ED Notes (Signed)
Ortho paged. 

## 2015-03-12 NOTE — Op Note (Signed)
See note [phone]

## 2015-03-12 NOTE — Consult Note (Signed)
  Patient with displaced right distal radius fracture and scaphoid fracture  Will have ED splint and proceed with ORIF above tomorrow with Dr Dion Saucier caring for left foot fracture/dislocation
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
## 2015-03-12 NOTE — Anesthesia Procedure Notes (Addendum)
Procedure Name: Intubation Date/Time: 03/12/2015 9:41 PM Performed by: Brien Mates D Pre-anesthesia Checklist: Patient identified, Emergency Drugs available, Suction available, Patient being monitored and Timeout performed Patient Re-evaluated:Patient Re-evaluated prior to inductionOxygen Delivery Method: Circle system utilized Preoxygenation: Pre-oxygenation with 100% oxygen Intubation Type: IV induction, Rapid sequence and Cricoid Pressure applied Laryngoscope Size: Miller and 2 Tube type: Oral Tube size: 7.5 mm Number of attempts: 1 Airway Equipment and Method: Stylet Placement Confirmation: ETT inserted through vocal cords under direct vision,  positive ETCO2 and breath sounds checked- equal and bilateral Secured at: 22 cm Tube secured with: Tape Dental Injury: Teeth and Oropharynx as per pre-operative assessment    Anesthesia Regional Block:  Supraclavicular block  Pre-Anesthetic Checklist: ,, timeout performed, Correct Patient, Correct Site, Correct Laterality, Correct Procedure, Correct Position, site marked, Risks and benefits discussed,  Surgical consent,  Pre-op evaluation,  At surgeon's request and post-op pain management  Laterality: Right  Prep: chloraprep       Needles:  Injection technique: Single-shot  Needle Type: Stimiplex     Needle Length: 5cm 5 cm Needle Gauge: 22 and 22 G    Additional Needles:  Procedures: ultrasound guided (picture in chart) and nerve stimulator Supraclavicular block  Nerve Stimulator or Paresthesia:  Response: biceps flexion,   Additional Responses:   Narrative:  Start time: 03/12/2015 9:15 PM End time: 03/12/2015 9:20 PM Injection made incrementally with aspirations every 5 mL.  Performed by: Personally  Anesthesiologist: Cyndie Chime  Additional Notes: Functioning IV was confirmed and monitors were applied.  A 50mm 22ga Stimuplex needle was used. Sterile prep and drape,hand hygiene and sterile gloves were  used.  Negative aspiration and negative test dose prior to incremental administration of local anesthetic. The patient tolerated the procedure well.
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
## 2015-03-13 ENCOUNTER — Encounter (HOSPITAL_COMMUNITY): Payer: Self-pay | Admitting: Orthopedic Surgery

## 2015-03-13 LAB — CBC
HEMATOCRIT: 38 % — AB (ref 39.0–52.0)
HEMOGLOBIN: 13.3 g/dL (ref 13.0–17.0)
MCH: 32 pg (ref 26.0–34.0)
MCHC: 35 g/dL (ref 30.0–36.0)
MCV: 91.6 fL (ref 78.0–100.0)
PLATELETS: 282 10*3/uL (ref 150–400)
RBC: 4.15 MIL/uL — AB (ref 4.22–5.81)
RDW: 12.6 % (ref 11.5–15.5)
WBC: 17.3 10*3/uL — AB (ref 4.0–10.5)

## 2015-03-13 LAB — SURGICAL PCR SCREEN
MRSA, PCR: NEGATIVE
Staphylococcus aureus: POSITIVE — AB

## 2015-03-13 LAB — CREATININE, SERUM
Creatinine, Ser: 1.08 mg/dL (ref 0.61–1.24)
GFR calc non Af Amer: >60 mL/min (ref >60)

## 2015-03-13 MED ORDER — CEFAZOLIN SODIUM 1-5 GM-% IV SOLN
1.0000 g | Freq: Four times a day (QID) | INTRAVENOUS | Status: AC
  Administered 2015-03-13 (×3): 1 g via INTRAVENOUS
  Filled 2015-03-13 (×3): qty 50

## 2015-03-13 MED ORDER — ONDANSETRON HCL 4 MG/2ML IJ SOLN
4.0000 mg | Freq: Four times a day (QID) | INTRAMUSCULAR | Status: DC | PRN
  Administered 2015-03-15: 4 mg via INTRAVENOUS

## 2015-03-13 MED ORDER — HYDROMORPHONE HCL 1 MG/ML IJ SOLN
1.0000 mg | INTRAMUSCULAR | Status: DC | PRN
  Administered 2015-03-13 – 2015-03-15 (×14): 2 mg via INTRAVENOUS
  Administered 2015-03-15: 1 mg via INTRAVENOUS
  Administered 2015-03-15 – 2015-03-16 (×4): 2 mg via INTRAVENOUS
  Administered 2015-03-16: 1 mg via INTRAVENOUS
  Administered 2015-03-16 (×3): 2 mg via INTRAVENOUS
  Administered 2015-03-16: 1 mg via INTRAVENOUS
  Filled 2015-03-13 (×13): qty 2
  Filled 2015-03-13: qty 1
  Filled 2015-03-13 (×4): qty 2
  Filled 2015-03-13: qty 1
  Filled 2015-03-13 (×4): qty 2

## 2015-03-13 MED ORDER — ENOXAPARIN SODIUM 40 MG/0.4ML ~~LOC~~ SOLN
40.0000 mg | SUBCUTANEOUS | Status: DC
  Administered 2015-03-13 – 2015-03-18 (×5): 40 mg via SUBCUTANEOUS
  Filled 2015-03-13 (×5): qty 0.4

## 2015-03-13 MED ORDER — SENNA 8.6 MG PO TABS
1.0000 | ORAL_TABLET | Freq: Two times a day (BID) | ORAL | Status: DC
  Administered 2015-03-13 – 2015-03-18 (×10): 8.6 mg via ORAL
  Filled 2015-03-13 (×10): qty 1

## 2015-03-13 MED ORDER — OXYCODONE HCL 5 MG PO TABS
5.0000 mg | ORAL_TABLET | ORAL | Status: DC | PRN
  Administered 2015-03-13 – 2015-03-18 (×28): 15 mg via ORAL
  Filled 2015-03-13 (×28): qty 3

## 2015-03-13 MED ORDER — HYDROMORPHONE HCL 1 MG/ML IJ SOLN
1.0000 mg | INTRAMUSCULAR | Status: DC | PRN
  Administered 2015-03-13 (×4): 1 mg via INTRAVENOUS

## 2015-03-13 MED ORDER — DOCUSATE SODIUM 100 MG PO CAPS
100.0000 mg | ORAL_CAPSULE | Freq: Two times a day (BID) | ORAL | Status: DC
  Administered 2015-03-13 – 2015-03-18 (×10): 100 mg via ORAL
  Filled 2015-03-13 (×10): qty 1

## 2015-03-13 MED ORDER — METOCLOPRAMIDE HCL 5 MG/ML IJ SOLN: 5.0000 mg | Freq: Three times a day (TID) | INTRAMUSCULAR | Status: DC | PRN

## 2015-03-13 MED ORDER — BISACODYL 10 MG RE SUPP
10.0000 mg | Freq: Every day | RECTAL | Status: DC | PRN
  Administered 2015-03-16: 10 mg via RECTAL
  Filled 2015-03-13: qty 1

## 2015-03-13 MED ORDER — MAGNESIUM CITRATE PO SOLN: 1.0000 | Freq: Once | ORAL | Status: DC | PRN

## 2015-03-13 MED ORDER — OXYCODONE HCL 5 MG PO TABS
5.0000 mg | ORAL_TABLET | ORAL | Status: DC | PRN
  Administered 2015-03-13 (×2): 10 mg via ORAL
  Filled 2015-03-13 (×2): qty 2

## 2015-03-13 MED ORDER — POLYETHYLENE GLYCOL 3350 17 G PO PACK
17.0000 g | PACK | Freq: Every day | ORAL | Status: DC | PRN
  Administered 2015-03-15: 17 g via ORAL
  Filled 2015-03-13 (×2): qty 1

## 2015-03-13 MED ORDER — ONDANSETRON HCL 4 MG PO TABS: 4.0000 mg | ORAL_TABLET | Freq: Four times a day (QID) | ORAL | Status: DC | PRN

## 2015-03-13 MED ORDER — OXYCODONE HCL ER 10 MG PO T12A
20.0000 mg | EXTENDED_RELEASE_TABLET | Freq: Two times a day (BID) | ORAL | Status: DC
  Administered 2015-03-13 – 2015-03-18 (×10): 20 mg via ORAL
  Filled 2015-03-13 (×10): qty 2

## 2015-03-13 MED ORDER — METOCLOPRAMIDE HCL 5 MG PO TABS: 5.0000 mg | ORAL_TABLET | Freq: Three times a day (TID) | ORAL | Status: DC | PRN

## 2015-03-13 MED ORDER — DIPHENHYDRAMINE HCL 12.5 MG/5ML PO ELIX: 12.5000 mg | ORAL_SOLUTION | ORAL | Status: DC | PRN

## 2015-03-13 MED ORDER — NICOTINE 21 MG/24HR TD PT24
21.0000 mg | MEDICATED_PATCH | Freq: Every day | TRANSDERMAL | Status: DC
  Administered 2015-03-13 – 2015-03-17 (×5): 21 mg via TRANSDERMAL
  Filled 2015-03-13 (×5): qty 1

## 2015-03-13 MED ORDER — ACETAMINOPHEN 325 MG PO TABS: 650.0000 mg | ORAL_TABLET | Freq: Four times a day (QID) | ORAL | Status: DC | PRN

## 2015-03-13 MED ORDER — ACETAMINOPHEN 650 MG RE SUPP: 650.0000 mg | Freq: Four times a day (QID) | RECTAL | Status: DC | PRN
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
## 2015-03-13 NOTE — Anesthesia Postprocedure Evaluation (Signed)
  Anesthesia Post-op Note  Patient: Erik Kelley  Procedure(s) Performed: Procedure(s): OPEN REDUCTION INTERNAL FIXATION (ORIF) RIGHT DISTAL RADIAL AND SCAPHOID FRACTURES AND CARPAL TUNNEL RELEASE, LEFT FOOT SPLINT  APPLICATION. (Right)  Patient Location: PACU  Anesthesia Type:General  Level of Consciousness: awake, alert  and oriented  Airway and Oxygen Therapy: Patient Spontanous Breathing  Post-op Pain: none  Post-op Assessment: Post-op Vital signs reviewed and Patient's Cardiovascular Status Stable              Post-op Vital Signs: Reviewed and stable  Last Vitals:  Filed Vitals:   03/13/15 0003  BP: 119/80  Pulse: 93  Temp: 37.7 C  Resp: 16    Complications: No apparent anesthesia complications
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
## 2015-03-13 NOTE — Progress Notes (Signed)
Orthopedic Tech Progress Note Patient Details:  Erik Kelley 13-Oct-1980 [id number] Patient already has short leg splint on. Patient ID: Erik Kelley, male   DOB: 04/08/1981, 34 y.o.   MRN: [id number]   Jennye Moccasin 03/13/2015, 3:46 PM
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
## 2015-03-14 ENCOUNTER — Other Ambulatory Visit (HOSPITAL_COMMUNITY): Payer: Self-pay | Admitting: Orthopedic Surgery

## 2015-03-14 ENCOUNTER — Other Ambulatory Visit (HOSPITAL_COMMUNITY): Payer: BLUE CROSS/BLUE SHIELD | Admitting: Orthopedic Surgery

## 2015-03-14 MED ORDER — SILVER SULFADIAZINE 1 % EX CREA
TOPICAL_CREAM | Freq: Every day | CUTANEOUS | Status: DC
  Filled 2015-03-14 (×3): qty 85

## 2015-03-14 NOTE — Progress Notes (Signed)
Thank you for consult on Erik Kelley. Note that he is progressing well with therapy and Dr. Lajoyce Corners plans on surgery later this week. Would recommend re-consult post op to help determine appropriate rehab needs.

## 2015-03-14 NOTE — Care Management Note (Signed)
Case Management Note  Patient Details  Name: Erik Kelley MRN: [id number] Date of Birth: 1981/03/29  Subjective/Objective:   S/p Left distal radius ORIF, left ankle fracture                 Action/Plan:  Case manager spoke with patient  concerning discharge needs. Patient is uninsured. States he wants to go to inpatient rehab if possible , Physical therapist will be making referral. Patient will need right platform walker for home.     Expected Discharge Date:                   Expected Discharge Plan:      In-House Referral:     Discharge planning Services     Post Acute Care Choice:    Choice offered to:     DME Arranged:    DME Agency:     HH Arranged:   NA HH Agency:     Status of Service:   in process  Medicare Important Message Given:    Date Medicare IM Given:    Medicare IM give by:    Date Additional Medicare IM Given:    Additional Medicare Important Message give by:     If discussed at Long Length of Stay Meetings, dates discussed:    Additional Comments:  Durenda Guthrie, RN 03/14/2015, 10:30 AM
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
## 2015-03-14 NOTE — Care Management (Signed)
Utilization review completed. Alessa Mazur, RN Case Manager [phone]. 
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
## 2015-03-15 ENCOUNTER — Inpatient Hospital Stay (HOSPITAL_COMMUNITY): Payer: BLUE CROSS/BLUE SHIELD | Admitting: Anesthesiology

## 2015-03-15 ENCOUNTER — Encounter (HOSPITAL_COMMUNITY)
Admission: EM | Disposition: A | Discharge status: Home / Self Care | Payer: Self-pay | Source: Home / Self Care | Attending: Orthopedic Surgery

## 2015-03-15 ENCOUNTER — Inpatient Hospital Stay (HOSPITAL_COMMUNITY): Payer: Self-pay | Admitting: Anesthesiology

## 2015-03-15 HISTORY — PX: OPEN REDUCTION INTERNAL FIXATION (ORIF) FOOT LISFRANC FRACTURE: SHX5990

## 2015-03-15 SURGERY — OPEN REDUCTION INTERNAL FIXATION (ORIF) FOOT LISFRANC FRACTURE
Anesthesia: Regional | Site: Foot | Laterality: Left

## 2015-03-15 MED ORDER — CEFAZOLIN SODIUM-DEXTROSE 2-3 GM-% IV SOLR
INTRAVENOUS | Status: DC | PRN
  Administered 2015-03-15: 2 g via INTRAVENOUS

## 2015-03-15 MED ORDER — FENTANYL CITRATE (PF) 100 MCG/2ML IJ SOLN: 25.0000 ug | INTRAMUSCULAR | Status: DC | PRN

## 2015-03-15 MED ORDER — METHOCARBAMOL 500 MG PO TABS
500.0000 mg | ORAL_TABLET | Freq: Four times a day (QID) | ORAL | Status: DC | PRN
  Administered 2015-03-16 – 2015-03-18 (×6): 500 mg via ORAL
  Filled 2015-03-15: qty 1

## 2015-03-15 MED ORDER — MIDAZOLAM HCL 5 MG/5ML IJ SOLN: INTRAMUSCULAR | Status: DC | PRN

## 2015-03-15 MED ORDER — OXYCODONE HCL 5 MG PO TABS: 5.0000 mg | ORAL_TABLET | Freq: Once | ORAL | Status: DC | PRN

## 2015-03-15 MED ORDER — MIDAZOLAM HCL 2 MG/2ML IJ SOLN
1.0000 mg | INTRAMUSCULAR | Status: DC | PRN
  Administered 2015-03-15: 2 mg via INTRAVENOUS

## 2015-03-15 MED ORDER — METOCLOPRAMIDE HCL 5 MG PO TABS: 5.0000 mg | ORAL_TABLET | Freq: Three times a day (TID) | ORAL | Status: DC | PRN

## 2015-03-15 MED ORDER — DEXTROSE 5 % IV SOLN
500.0000 mg | Freq: Four times a day (QID) | INTRAVENOUS | Status: DC | PRN
  Filled 2015-03-15: qty 5

## 2015-03-15 MED ORDER — PHENYLEPHRINE HCL 10 MG/ML IJ SOLN
INTRAMUSCULAR | Status: DC | PRN
  Administered 2015-03-15 (×2): 80 ug via INTRAVENOUS

## 2015-03-15 MED ORDER — MIDAZOLAM HCL 2 MG/2ML IJ SOLN
INTRAMUSCULAR | Status: AC
  Administered 2015-03-15: 2 mg via INTRAVENOUS
  Filled 2015-03-15: qty 2

## 2015-03-15 MED ORDER — FENTANYL CITRATE (PF) 100 MCG/2ML IJ SOLN
INTRAMUSCULAR | Status: AC
  Administered 2015-03-15: 100 ug via INTRAVENOUS
  Filled 2015-03-15: qty 2

## 2015-03-15 MED ORDER — ONDANSETRON HCL 4 MG/2ML IJ SOLN: 4.0000 mg | Freq: Once | INTRAMUSCULAR | Status: DC | PRN

## 2015-03-15 MED ORDER — ONDANSETRON HCL 4 MG/2ML IJ SOLN: 4.0000 mg | Freq: Four times a day (QID) | INTRAMUSCULAR | Status: DC | PRN

## 2015-03-15 MED ORDER — SODIUM CHLORIDE 0.9 % IV SOLN
INTRAVENOUS | Status: DC
  Administered 2015-03-16: 08:00:00 via INTRAVENOUS

## 2015-03-15 MED ORDER — MUPIROCIN 2 % EX OINT
1.0000 "application " | TOPICAL_OINTMENT | Freq: Two times a day (BID) | CUTANEOUS | Status: DC
  Administered 2015-03-16 – 2015-03-18 (×6): 1 via NASAL
  Filled 2015-03-15 (×2): qty 22

## 2015-03-15 MED ORDER — MIDAZOLAM HCL 2 MG/2ML IJ SOLN
INTRAMUSCULAR | Status: AC
  Filled 2015-03-15: qty 4

## 2015-03-15 MED ORDER — METOCLOPRAMIDE HCL 5 MG/ML IJ SOLN: 5.0000 mg | Freq: Three times a day (TID) | INTRAMUSCULAR | Status: DC | PRN

## 2015-03-15 MED ORDER — LIDOCAINE HCL (CARDIAC) 20 MG/ML IV SOLN
INTRAVENOUS | Status: DC | PRN
  Administered 2015-03-15: 40 mg via INTRAVENOUS

## 2015-03-15 MED ORDER — FENTANYL CITRATE (PF) 100 MCG/2ML IJ SOLN
INTRAMUSCULAR | Status: DC | PRN
  Administered 2015-03-15: 50 ug via INTRAVENOUS

## 2015-03-15 MED ORDER — FENTANYL CITRATE (PF) 250 MCG/5ML IJ SOLN
INTRAMUSCULAR | Status: AC
Start: 2015-03-15 — End: 2015-03-15
  Filled 2015-03-15: qty 5

## 2015-03-15 MED ORDER — ACETAMINOPHEN 650 MG RE SUPP: 650.0000 mg | Freq: Four times a day (QID) | RECTAL | Status: DC | PRN

## 2015-03-15 MED ORDER — FENTANYL CITRATE (PF) 100 MCG/2ML IJ SOLN
50.0000 ug | INTRAMUSCULAR | Status: DC | PRN
  Administered 2015-03-15: 100 ug via INTRAVENOUS

## 2015-03-15 MED ORDER — MIDAZOLAM HCL 5 MG/5ML IJ SOLN
INTRAMUSCULAR | Status: DC | PRN
  Administered 2015-03-15: 2 mg via INTRAVENOUS

## 2015-03-15 MED ORDER — CEFAZOLIN SODIUM-DEXTROSE 2-3 GM-% IV SOLR
2.0000 g | Freq: Four times a day (QID) | INTRAVENOUS | Status: AC
  Administered 2015-03-15 (×2): 2 g via INTRAVENOUS
  Filled 2015-03-15 (×3): qty 50

## 2015-03-15 MED ORDER — 0.9 % SODIUM CHLORIDE (POUR BTL) OPTIME
TOPICAL | Status: DC | PRN
  Administered 2015-03-15: 1000 mL

## 2015-03-15 MED ORDER — ONDANSETRON HCL 4 MG PO TABS: 4.0000 mg | ORAL_TABLET | Freq: Four times a day (QID) | ORAL | Status: DC | PRN

## 2015-03-15 MED ORDER — MUPIROCIN 2 % EX OINT
TOPICAL_OINTMENT | CUTANEOUS | Status: AC
  Filled 2015-03-15: qty 22

## 2015-03-15 MED ORDER — PROPOFOL 10 MG/ML IV BOLUS
INTRAVENOUS | Status: DC | PRN
  Administered 2015-03-15: 200 mg via INTRAVENOUS

## 2015-03-15 MED ORDER — LACTATED RINGERS IV SOLN
INTRAVENOUS | Status: DC
  Administered 2015-03-15: 10:00:00 via INTRAVENOUS

## 2015-03-15 MED ORDER — CHLORHEXIDINE GLUCONATE CLOTH 2 % EX PADS
6.0000 | MEDICATED_PAD | Freq: Every day | CUTANEOUS | Status: DC
  Administered 2015-03-15 – 2015-03-18 (×4): 6 via TOPICAL

## 2015-03-15 MED ORDER — MUPIROCIN 2 % EX OINT
TOPICAL_OINTMENT | CUTANEOUS | Status: DC | PRN
  Administered 2015-03-15: 1 via TOPICAL

## 2015-03-15 MED ORDER — ACETAMINOPHEN 325 MG PO TABS
650.0000 mg | ORAL_TABLET | Freq: Four times a day (QID) | ORAL | Status: DC | PRN
  Administered 2015-03-16: 650 mg via ORAL
  Filled 2015-03-15: qty 2

## 2015-03-15 MED ORDER — OXYCODONE HCL 5 MG/5ML PO SOLN: 5.0000 mg | Freq: Once | ORAL | Status: DC | PRN

## 2015-03-15 SURGICAL SUPPLY — 43 items
BANDAGE ESMARK 6X9 LF (GAUZE/BANDAGES/DRESSINGS) IMPLANT
BIT DRILL CANN 3.2 (BIT) IMPLANT
BNDG CMPR 9X6 STRL LF SNTH (GAUZE/BANDAGES/DRESSINGS)
BNDG COHESIVE 4X5 TAN STRL (GAUZE/BANDAGES/DRESSINGS) ×2 IMPLANT
BNDG ESMARK 6X9 LF (GAUZE/BANDAGES/DRESSINGS)
BNDG GAUZE ELAST 4 BULKY (GAUZE/BANDAGES/DRESSINGS) ×2 IMPLANT
COVER SURGICAL LIGHT HANDLE (MISCELLANEOUS) ×3 IMPLANT
CUFF TOURNIQUET SINGLE 34IN LL (TOURNIQUET CUFF) IMPLANT
CUFF TOURNIQUET SINGLE 44IN (TOURNIQUET CUFF) IMPLANT
DRAPE INCISE IOBAN 66X45 STRL (DRAPES) ×2 IMPLANT
DRAPE OEC MINIVIEW 54X84 (DRAPES) IMPLANT
DRAPE PROXIMA HALF (DRAPES) ×1 IMPLANT
DRAPE U-SHAPE 47X51 STRL (DRAPES) ×2 IMPLANT
DRILL BIT CANN 3.2 (BIT) ×2
DRSG ADAPTIC 3X8 NADH LF (GAUZE/BANDAGES/DRESSINGS) ×2 IMPLANT
DRSG PAD ABDOMINAL 8X10 ST (GAUZE/BANDAGES/DRESSINGS) ×1 IMPLANT
DURAPREP 26ML APPLICATOR (WOUND CARE) ×2 IMPLANT
ELECT REM PT RETURN 9FT ADLT (ELECTROSURGICAL) ×2
ELECTRODE REM PT RTRN 9FT ADLT (ELECTROSURGICAL) ×1 IMPLANT
GAUZE SPONGE 4X4 12PLY STRL (GAUZE/BANDAGES/DRESSINGS) ×1 IMPLANT
GLOVE BIOGEL PI IND STRL 9 (GLOVE) ×1 IMPLANT
GLOVE BIOGEL PI INDICATOR 9 (GLOVE) ×1
GLOVE SURG ORTHO 9.0 STRL STRW (GLOVE) ×2 IMPLANT
GOWN STRL REUS W/ TWL XL LVL3 (GOWN DISPOSABLE) ×3 IMPLANT
GOWN STRL REUS W/TWL XL LVL3 (GOWN DISPOSABLE) ×4
GUIDEWIRE NON THREAD 1.6MM (WIRE) ×3 IMPLANT
KIT BASIN OR (CUSTOM PROCEDURE TRAY) ×2 IMPLANT
KIT ROOM TURNOVER OR (KITS) ×2 IMPLANT
MANIFOLD NEPTUNE II (INSTRUMENTS) ×2 IMPLANT
NS IRRIG 1000ML POUR BTL (IV SOLUTION) ×2 IMPLANT
PACK ORTHO EXTREMITY (CUSTOM PROCEDURE TRAY) ×2 IMPLANT
PAD ABD 8X10 STRL (GAUZE/BANDAGES/DRESSINGS) ×1 IMPLANT
PAD ARMBOARD 7.5X6 YLW CONV (MISCELLANEOUS) ×3 IMPLANT
SCREW COMPR HDLS ST 4.5X40 (Screw) ×2 IMPLANT
SPONGE GAUZE 4X4 12PLY STER LF (GAUZE/BANDAGES/DRESSINGS) ×1 IMPLANT
SPONGE LAP 18X18 X RAY DECT (DISPOSABLE) ×1 IMPLANT
STAPLER VISISTAT 35W (STAPLE) IMPLANT
SUCTION FRAZIER TIP 10 FR DISP (SUCTIONS) ×2 IMPLANT
SUT ETHILON 2 0 PSLX (SUTURE) IMPLANT
SUT VIC AB 2-0 CTB1 (SUTURE) ×3 IMPLANT
TOWEL OR 17X24 6PK STRL BLUE (TOWEL DISPOSABLE) ×2 IMPLANT
TOWEL OR 17X26 10 PK STRL BLUE (TOWEL DISPOSABLE) ×2 IMPLANT
TUBE CONNECTING 12X1/4 (SUCTIONS) ×2 IMPLANT
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
## 2015-03-15 NOTE — Progress Notes (Signed)
Inpatient Rehabilitation  Per Delle Reining, [place], pt. Currently at min guard and is likely to progress to supervision/mod I in 1-2 days.  At this time, we are not recommending IP Rehab consult.  Thanks!  Weldon Picking PT Inpatient Rehab Admissions Coordinator Cell [phone] Office [phone]

## 2015-03-15 NOTE — Anesthesia Postprocedure Evaluation (Signed)
  Anesthesia Post-op Note  Patient: Erik Kelley  Procedure(s) Performed: Procedure(s): OPEN REDUCTION INTERNAL FIXATION (ORIF) FOOT LISFRANC FRACTURE; LEFT (Left)  Patient Location: PACU  Anesthesia Type:General and GA combined with regional for post-op pain  Level of Consciousness: awake, alert  and oriented  Airway and Oxygen Therapy: Patient Spontanous Breathing and Patient connected to nasal cannula oxygen  Post-op Pain: none  Post-op Assessment: Post-op Vital signs reviewed, Patient's Cardiovascular Status Stable, Patent Airway and Pain level controlled LLE Motor Response: No movement due to regional block LLE Sensation: Numbness          Post-op Vital Signs: stable  Last Vitals:  Filed Vitals:   03/15/15 1205  BP:   Pulse: 79  Temp: 37 C  Resp: 10    Complications: No apparent anesthesia complications

## 2015-03-15 NOTE — Progress Notes (Signed)
Note that patient underwent ORIF left ankle earlier today. PT notes this afternoon indicate that patient is at min guard assist for transfers and ambulation of 80'. Anticipate that patient should progress to supervision/modified independent level with therapy in the next 1-2 days. Will defer CIR consult for now.

## 2015-03-15 NOTE — Anesthesia Procedure Notes (Addendum)
Procedure Name: LMA Insertion Date/Time: 03/15/2015 10:25 AM Performed by: Adonis Housekeeper Pre-anesthesia Checklist: Patient identified, Emergency Drugs available, Suction available and Patient being monitored Patient Re-evaluated:Patient Re-evaluated prior to inductionOxygen Delivery Method: Circle system utilized Preoxygenation: Pre-oxygenation with 100% oxygen Intubation Type: IV induction Ventilation: Mask ventilation without difficulty LMA: LMA inserted LMA Size: 5.0 Number of attempts: 1 Placement Confirmation: breath sounds checked- equal and bilateral and positive ETCO2 Tube secured with: Tape Dental Injury: Teeth and Oropharynx as per pre-operative assessment    Anesthesia Regional Block:  Popliteal block  Pre-Anesthetic Checklist: ,, timeout performed, Correct Patient, Correct Site, Correct Laterality, Correct Procedure, Correct Position, site marked, Risks and benefits discussed,  Surgical consent,  Pre-op evaluation,  At surgeon's request and post-op pain management  Laterality: Left  Prep: chloraprep       Needles:  Injection technique: Single-shot  Needle Type: Echogenic Stimulator Needle     Needle Length: 9cm 9 cm Needle Gauge: 21 and 21 G    Additional Needles:  Procedures: ultrasound guided (picture in chart) Popliteal block Narrative:  Start time: 03/15/2015 10:20 AM End time: 03/15/2015 10:25 AM Injection made incrementally with aspirations every 5 mL.  Performed by: Personally   Additional Notes: 30 cc 0.5% Bupivacaine injected easily

## 2015-03-15 NOTE — Transfer of Care (Signed)
Immediate Anesthesia Transfer of Care Note  Patient: Erik Kelley  Procedure(s) Performed: Procedure(s): OPEN REDUCTION INTERNAL FIXATION (ORIF) FOOT LISFRANC FRACTURE; LEFT (Left)  Patient Location: PACU  Anesthesia Type:General and Regional  Level of Consciousness: patient cooperative and lethargic  Airway & Oxygen Therapy: Patient Spontanous Breathing and Patient connected to nasal cannula oxygen  Post-op Assessment: Report given to RN and Post -op Vital signs reviewed and stable  Post vital signs: Reviewed and stable  Last Vitals:  Filed Vitals:   03/15/15 1010  BP:   Pulse: 112  Temp:   Resp: 10    Complications: No apparent anesthesia complications
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
## 2015-03-15 NOTE — Anesthesia Preprocedure Evaluation (Signed)
Anesthesia Evaluation  Patient identified by MRN, date of birth, ID band Patient awake    Reviewed: Allergy & Precautions, NPO status , Patient's Chart, lab work & pertinent test results  Airway Mallampati: II  TM Distance: >3 FB Neck ROM: Full    Dental  (+) Teeth Intact, Dental Advisory Given   Pulmonary Current Smoker,  breath sounds clear to auscultation        Cardiovascular Rhythm:Regular Rate:Normal     Neuro/Psych    GI/Hepatic   Endo/Other    Renal/GU      Musculoskeletal   Abdominal   Peds  Hematology   Anesthesia Other Findings   Reproductive/Obstetrics                             Anesthesia Physical Anesthesia Plan  ASA: II  Anesthesia Plan: General   Post-op Pain Management: MAC Combined w/ Regional for Post-op pain   Induction: Intravenous  Airway Management Planned: LMA  Additional Equipment:   Intra-op Plan:   Post-operative Plan:   Informed Consent: I have reviewed the patients History and Physical, chart, labs and discussed the procedure including the risks, benefits and alternatives for the proposed anesthesia with the patient or authorized representative who has indicated his/her understanding and acceptance.   Dental advisory given  Plan Discussed with: CRNA and Anesthesiologist  Anesthesia Plan Comments:         Anesthesia Quick Evaluation
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
## 2015-03-15 NOTE — Interval H&P Note (Signed)
History and Physical Interval Note:  03/15/2015 6:17 AM  Erik Kelley  has presented today for surgery, with the diagnosis of Lisfranc Fracture/Dislocation  The various methods of treatment have been discussed with the patient and family. After consideration of risks, benefits and other options for treatment, the patient has consented to  Procedure(s): OPEN REDUCTION INTERNAL FIXATION (ORIF) FOOT LISFRANC FRACTURE (Left) as a surgical intervention .  The patient's history has been reviewed, patient examined, no change in status, stable for surgery.  I have reviewed the patient's chart and labs.  Questions were answered to the patient's satisfaction.     DUDA,MARCUS V

## 2015-03-16 ENCOUNTER — Encounter (HOSPITAL_COMMUNITY): Payer: Self-pay | Admitting: Orthopedic Surgery

## 2015-03-16 NOTE — Progress Notes (Signed)
Orthopedic Tech Progress Note Patient Details:  Erik Kelley 12-21-1980 [id number]  Ortho Devices Type of Ortho Device: CAM walker Ortho Device/Splint Location: lle Ortho Device/Splint Interventions: Application   [name] 03/16/2015, 7:15 AM
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
## 2015-03-16 NOTE — Progress Notes (Signed)
Patient ID: Erik Kelley, male   DOB: Jan 16, 1981, 34 y.o.   MRN: [id number] Postoperative day 1 status post open reduction internal fixation Lisfranc fracture dislocation left foot. Patient is making good progress with therapy. Inpatient rehabilitation is not recommending inpatient rehabilitation at this time. They feel the patient should be able to go home in a day or 2. We'll plan for discharge to home with home health therapy.
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
## 2015-03-17 MED ORDER — METHOCARBAMOL 500 MG PO TABS: 500.0000 mg | ORAL_TABLET | Freq: Three times a day (TID) | ORAL | Status: DC

## 2015-03-17 MED ORDER — OXYCODONE-ACETAMINOPHEN 5-325 MG PO TABS: 1.0000 | ORAL_TABLET | ORAL | Status: DC | PRN
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
## 2015-03-17 NOTE — Progress Notes (Signed)
PT Cancellation Note  Patient Details Name: Erik Kelley MRN: [id number] DOB: 1981-02-25   Cancelled Treatment:    Reason Eval/Treat Not Completed: Other (comment). Patient stated that he had been up and walking in the room on his own. Declines further therapy.  Will signoff on acute PT at this time.    Fredrich Birks 03/17/2015, 1:43 PM
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
## 2015-03-18 NOTE — Discharge Summary (Signed)
  Discharge delayed secondary to pain. Patient's plan for discharge today no change in condition or status follow-up in the office in a week or 2 nonweightbearing left lower extremity. Patient has fracture boot to be used for his ambulation's.

## 2015-03-18 NOTE — Progress Notes (Signed)
Pt ready for d/c per MD. All equipment has been delivered to room, cleared by PT/OT. Discharge instructions and prescriptions given to pt at bedside, all questions answered. Waiting for friend to return for ride home. Will continue to monitor.   Lowella Dell 03/18/2015 11:03 AM

## 2018-03-08 DIAGNOSIS — Z79899 Other long term (current) drug therapy: Secondary | ICD-10-CM | POA: Diagnosis not present

## 2018-03-08 DIAGNOSIS — R531 Weakness: Secondary | ICD-10-CM | POA: Insufficient documentation

## 2018-03-08 DIAGNOSIS — R202 Paresthesia of skin: Secondary | ICD-10-CM | POA: Diagnosis not present

## 2018-03-08 DIAGNOSIS — F17210 Nicotine dependence, cigarettes, uncomplicated: Secondary | ICD-10-CM | POA: Insufficient documentation

## 2018-03-08 DIAGNOSIS — T50905A Adverse effect of unspecified drugs, medicaments and biological substances, initial encounter: Secondary | ICD-10-CM | POA: Insufficient documentation

## 2018-03-08 NOTE — ED Triage Notes (Signed)
Patient arrived with PTAR from home took his brother's Seroquel 300mg  tab at 10 pm this evening , reports generalized body tingling and agitation after taking the medication , ambulatory / respirations unlabored .

## 2018-03-09 ENCOUNTER — Emergency Department (HOSPITAL_COMMUNITY)
Admission: EM | Admit: 2018-03-09 | Discharge: 2018-03-09 | Disposition: A | Discharge status: Home / Self Care | Payer: 59 | Attending: Emergency Medicine | Admitting: Emergency Medicine

## 2018-03-09 ENCOUNTER — Encounter (HOSPITAL_COMMUNITY): Payer: Self-pay | Admitting: Emergency Medicine

## 2018-03-09 DIAGNOSIS — T50905A Adverse effect of unspecified drugs, medicaments and biological substances, initial encounter: Secondary | ICD-10-CM

## 2018-03-09 NOTE — ED Provider Notes (Signed)
[facility] Provider Note   CSN: [id number] Arrival date & time: 03/08/18  2328     History   Chief Complaint Chief Complaint  Patient presents with  . Gen. Weakness/Tingling    HPI Erik Kelley is a 37 y.o. male.  37 yo M with a chief complaint of tingling nervousness difficulty sleeping.  Patient took a friend's Seroquel to try and sleep and it caused him to feel this way.  Feels it is gotten slightly better.  Took only 1 tablet.  Denies any coingestions.  The history is provided by the patient.  Illness  This is a new problem. The current episode started 1 to 2 hours ago. The problem occurs constantly. The problem has not changed since onset.Pertinent negatives include no chest pain, no abdominal pain, no headaches and no shortness of breath. Nothing aggravates the symptoms. Nothing relieves the symptoms. He has tried nothing for the symptoms. The treatment provided no relief.    Past Medical History:  Diagnosis Date  . Chronic back pain   . Neck pain     Patient Active Problem List   Diagnosis Date Noted  . Lisfranc dislocation 03/12/2015    Past Surgical History:  Procedure Laterality Date  . OPEN REDUCTION INTERNAL FIXATION (ORIF) DISTAL RADIAL FRACTURE Right 03/12/2015   Procedure: OPEN REDUCTION INTERNAL FIXATION (ORIF) RIGHT DISTAL RADIAL AND SCAPHOID FRACTURES AND CARPAL TUNNEL RELEASE, LEFT FOOT SPLINT  APPLICATION.;  Surgeon: Dairl PonderMatthew Weingold, MD;  Location: [facility] OR;  Service: Orthopedics;  Laterality: Right;  . OPEN REDUCTION INTERNAL FIXATION (ORIF) FOOT LISFRANC FRACTURE Left 03/15/2015   Procedure: OPEN REDUCTION INTERNAL FIXATION (ORIF) FOOT LISFRANC FRACTURE; LEFT;  Surgeon: Nadara MustardMarcus Duda V, MD;  Location: [facility] OR;  Service: Orthopedics;  Laterality: Left;        Home Medications    Prior to Admission medications   Medication Sig Start Date End Date Taking? Authorizing Provider  methocarbamol (ROBAXIN) 500 MG  tablet Take 1 tablet (500 mg total) by mouth 3 (three) times daily. 03/17/15   Nadara Mustarduda, Marcus V, MD  oxyCODONE-acetaminophen (ROXICET) 5-325 MG per tablet Take 1 tablet by mouth every 4 (four) hours as needed for severe pain. 03/17/15   Nadara Mustarduda, Marcus V, MD    Family History No family history on file.  Social History Social History   Tobacco Use  . Smoking status: Current Every Day Smoker    Types: Cigarettes  . Smokeless tobacco: Never Used  Substance Use Topics  . Alcohol use: Yes    Comment: "socially"   . Drug use: No     Allergies   Patient has no known allergies.   Review of Systems Review of Systems  Constitutional: Negative for chills and fever.  HENT: Negative for congestion and facial swelling.   Eyes: Negative for discharge and visual disturbance.  Respiratory: Negative for shortness of breath.   Cardiovascular: Negative for chest pain and palpitations.  Gastrointestinal: Negative for abdominal pain, diarrhea and vomiting.  Musculoskeletal: Negative for arthralgias and myalgias.  Skin: Negative for color change and rash.  Neurological: Positive for light-headedness and numbness (tingling). Negative for tremors, syncope and headaches.  Psychiatric/Behavioral: Negative for confusion and dysphoric mood.     Physical Exam Updated Vital Signs BP 105/71 (BP Location: Right Arm)   Pulse (!) 109   Temp 97.6 F (36.4 C) (Oral)   Resp 15   Ht 5\' 7"  (1.702 m)   Wt 72.6 kg   SpO2 100%  BMI 25.06 kg/m   Physical Exam  Constitutional: He is oriented to person, place, and time. He appears well-developed and well-nourished.  HENT:  Head: Normocephalic and atraumatic.  Eyes: Pupils are equal, round, and reactive to light. EOM are normal.  Neck: Normal range of motion. Neck supple. No JVD present.  Cardiovascular: Normal rate and regular rhythm. Exam reveals no gallop and no friction rub.  No murmur heard. Pulmonary/Chest: No respiratory distress. He has no wheezes.    Abdominal: He exhibits no distension and no mass. There is no tenderness. There is no rebound and no guarding.  Musculoskeletal: Normal range of motion.  Neurological: He is alert and oriented to person, place, and time.  Skin: No rash noted. No pallor.  Psychiatric: He has a normal mood and affect. His behavior is normal.  Nursing note and vitals reviewed.    ED Treatments / Results  Labs (all labs ordered are listed, but only abnormal results are displayed) Labs Reviewed - No data to display  EKG None  Radiology No results found.  Procedures Procedures (including critical care time)  Medications Ordered in ED Medications - No data to display   Initial Impression / Assessment and Plan / ED Course  I have reviewed the triage vital signs and the nursing notes.  Pertinent labs & imaging results that were available during my care of the patient were reviewed by me and considered in my medical decision making (see chart for details).     37 yo M with multiple subjective complaints this occurred after he took someone else's medication.  He did not take enough to cause any adverse reactions.  He is mildly tachycardic on initial vitals but on my bedside exam his heart rate is normal.  There is no tachypnea clear lungs no abdominal tenderness.  At this point I will discharge the patient and have him follow-up with his PCP.  I instructed him not to take someone else's medication.  3:36 AM:  I have discussed the diagnosis/risks/treatment options with the patient and believe the pt to be eligible for discharge home to follow-up with PCP. We also discussed returning to the ED immediately if new or worsening sx occur. We discussed the sx which are most concerning (e.g., sudden worsening pain, fever, inability to tolerate by mouth) that necessitate immediate return. Medications administered to the patient during their visit and any new prescriptions provided to the patient are listed  below.  Medications given during this visit Medications - No data to display   The patient appears reasonably screen and/or stabilized for discharge and I doubt any other medical condition or other [facility] requiring further screening, evaluation, or treatment in the ED at this time prior to discharge.    Final Clinical Impressions(s) / ED Diagnoses   Final diagnoses:  Adverse effect of drug, initial encounter    ED Discharge Orders    None       Melene PlanFloyd, Anni Hocevar, [place]DO 03/09/18 [phone]

## 2018-03-09 NOTE — Discharge Instructions (Signed)
Follow up with your PCP.  Do not take other peoples medication.

## 2020-06-20 ENCOUNTER — Emergency Department (HOSPITAL_COMMUNITY): Payer: BC Managed Care – PPO | Admitting: Certified Registered Nurse Anesthetist

## 2020-06-20 ENCOUNTER — Other Ambulatory Visit: Payer: Self-pay

## 2020-06-20 ENCOUNTER — Encounter (HOSPITAL_COMMUNITY): Payer: Self-pay | Admitting: Certified Registered Nurse Anesthetist

## 2020-06-20 ENCOUNTER — Encounter (HOSPITAL_COMMUNITY)
Admission: EM | Disposition: A | Discharge status: Home / Self Care | Payer: Self-pay | Source: Home / Self Care | Attending: Emergency Medicine

## 2020-06-20 ENCOUNTER — Emergency Department (HOSPITAL_COMMUNITY): Payer: BC Managed Care – PPO

## 2020-06-20 ENCOUNTER — Ambulatory Visit (HOSPITAL_COMMUNITY)
Admission: EM | Admit: 2020-06-20 | Discharge: 2020-06-20 | Disposition: A | Discharge status: Home / Self Care | Payer: BC Managed Care – PPO | Attending: Emergency Medicine | Admitting: Emergency Medicine

## 2020-06-20 DIAGNOSIS — Z79899 Other long term (current) drug therapy: Secondary | ICD-10-CM | POA: Diagnosis not present

## 2020-06-20 DIAGNOSIS — F17210 Nicotine dependence, cigarettes, uncomplicated: Secondary | ICD-10-CM | POA: Diagnosis not present

## 2020-06-20 DIAGNOSIS — S68522A Partial traumatic transphalangeal amputation of left thumb, initial encounter: Secondary | ICD-10-CM | POA: Insufficient documentation

## 2020-06-20 DIAGNOSIS — W312XXA Contact with powered woodworking and forming machines, initial encounter: Secondary | ICD-10-CM | POA: Insufficient documentation

## 2020-06-20 DIAGNOSIS — S68029A Partial traumatic metacarpophalangeal amputation of unspecified thumb, initial encounter: Secondary | ICD-10-CM

## 2020-06-20 DIAGNOSIS — Z23 Encounter for immunization: Secondary | ICD-10-CM | POA: Insufficient documentation

## 2020-06-20 DIAGNOSIS — Z20822 Contact with and (suspected) exposure to covid-19: Secondary | ICD-10-CM | POA: Diagnosis not present

## 2020-06-20 HISTORY — PX: I & D EXTREMITY: SHX5045

## 2020-06-20 LAB — RESP PANEL BY RT-PCR (FLU A&B, COVID) ARPGX2
Influenza A by PCR: NEGATIVE
Influenza B by PCR: NEGATIVE
SARS Coronavirus 2 by RT PCR: NEGATIVE

## 2020-06-20 SURGERY — IRRIGATION AND DEBRIDEMENT EXTREMITY
Anesthesia: General | Site: Thumb | Laterality: Left

## 2020-06-20 MED ORDER — LIDOCAINE 2% (20 MG/ML) 5 ML SYRINGE
INTRAMUSCULAR | Status: DC | PRN
  Administered 2020-06-20: 60 mg via INTRAVENOUS

## 2020-06-20 MED ORDER — FENTANYL CITRATE (PF) 100 MCG/2ML IJ SOLN
INTRAMUSCULAR | Status: DC | PRN
  Administered 2020-06-20: 100 ug via INTRAVENOUS
  Administered 2020-06-20 (×2): 50 ug via INTRAVENOUS

## 2020-06-20 MED ORDER — MIDAZOLAM HCL 5 MG/5ML IJ SOLN
INTRAMUSCULAR | Status: DC | PRN
  Administered 2020-06-20: 2 mg via INTRAVENOUS

## 2020-06-20 MED ORDER — POVIDONE-IODINE 10 % EX SWAB: 2.0000 "application " | Freq: Once | CUTANEOUS | Status: DC

## 2020-06-20 MED ORDER — BUPIVACAINE HCL (PF) 0.25 % IJ SOLN
INTRAMUSCULAR | Status: AC
  Filled 2020-06-20: qty 30

## 2020-06-20 MED ORDER — ONDANSETRON HCL 4 MG/2ML IJ SOLN
INTRAMUSCULAR | Status: DC | PRN
  Administered 2020-06-20: 4 mg via INTRAVENOUS

## 2020-06-20 MED ORDER — 0.9 % SODIUM CHLORIDE (POUR BTL) OPTIME
TOPICAL | Status: DC | PRN
  Administered 2020-06-20: 1000 mL

## 2020-06-20 MED ORDER — FENTANYL CITRATE (PF) 100 MCG/2ML IJ SOLN
INTRAMUSCULAR | Status: AC
  Filled 2020-06-20: qty 2

## 2020-06-20 MED ORDER — CEFAZOLIN SODIUM-DEXTROSE 2-4 GM/100ML-% IV SOLN
2.0000 g | INTRAVENOUS | Status: AC
  Administered 2020-06-20: 2 g via INTRAVENOUS

## 2020-06-20 MED ORDER — FENTANYL CITRATE (PF) 250 MCG/5ML IJ SOLN
INTRAMUSCULAR | Status: AC
  Filled 2020-06-20: qty 5

## 2020-06-20 MED ORDER — TETANUS-DIPHTH-ACELL PERTUSSIS 5-2.5-18.5 LF-MCG/0.5 IM SUSY
0.5000 mL | PREFILLED_SYRINGE | Freq: Once | INTRAMUSCULAR | Status: AC
  Administered 2020-06-20: 0.5 mL via INTRAMUSCULAR
  Filled 2020-06-20: qty 0.5

## 2020-06-20 MED ORDER — OXYCODONE HCL 5 MG PO TABS: 5.0000 mg | ORAL_TABLET | Freq: Once | ORAL | Status: DC | PRN

## 2020-06-20 MED ORDER — SODIUM CHLORIDE 0.9 % IR SOLN
Status: DC | PRN
  Administered 2020-06-20: 3000 mL

## 2020-06-20 MED ORDER — HYDROMORPHONE HCL 1 MG/ML IJ SOLN
1.0000 mg | Freq: Once | INTRAMUSCULAR | Status: AC
  Administered 2020-06-20: 1 mg via INTRAVENOUS
  Filled 2020-06-20: qty 1

## 2020-06-20 MED ORDER — OXYCODONE-ACETAMINOPHEN 10-325 MG PO TABS
1.0000 | ORAL_TABLET | Freq: Four times a day (QID) | ORAL | 0 refills | Status: AC | PRN
Start: 2020-06-20 — End: 2021-06-20

## 2020-06-20 MED ORDER — DEXAMETHASONE SODIUM PHOSPHATE 10 MG/ML IJ SOLN
INTRAMUSCULAR | Status: AC
  Filled 2020-06-20: qty 1

## 2020-06-20 MED ORDER — SUCCINYLCHOLINE CHLORIDE 200 MG/10ML IV SOSY
PREFILLED_SYRINGE | INTRAVENOUS | Status: AC
  Filled 2020-06-20: qty 10

## 2020-06-20 MED ORDER — CEFAZOLIN SODIUM-DEXTROSE 2-4 GM/100ML-% IV SOLN
INTRAVENOUS | Status: AC
  Filled 2020-06-20: qty 100

## 2020-06-20 MED ORDER — ONDANSETRON HCL 4 MG/2ML IJ SOLN
INTRAMUSCULAR | Status: AC
  Filled 2020-06-20: qty 2

## 2020-06-20 MED ORDER — LACTATED RINGERS IV SOLN: INTRAVENOUS | Status: DC | PRN

## 2020-06-20 MED ORDER — CHLORHEXIDINE GLUCONATE 4 % EX LIQD: 60.0000 mL | Freq: Once | CUTANEOUS | Status: DC

## 2020-06-20 MED ORDER — PROPOFOL 10 MG/ML IV BOLUS
INTRAVENOUS | Status: DC | PRN
  Administered 2020-06-20: 200 mg via INTRAVENOUS

## 2020-06-20 MED ORDER — ONDANSETRON HCL 4 MG/2ML IJ SOLN
4.0000 mg | Freq: Once | INTRAMUSCULAR | Status: AC
  Administered 2020-06-20: 4 mg via INTRAVENOUS
  Filled 2020-06-20: qty 2

## 2020-06-20 MED ORDER — LIDOCAINE HCL (PF) 2 % IJ SOLN
INTRAMUSCULAR | Status: AC
  Filled 2020-06-20: qty 5

## 2020-06-20 MED ORDER — ONDANSETRON HCL 4 MG/2ML IJ SOLN: 4.0000 mg | Freq: Four times a day (QID) | INTRAMUSCULAR | Status: DC | PRN

## 2020-06-20 MED ORDER — CEFAZOLIN SODIUM-DEXTROSE 1-4 GM/50ML-% IV SOLN
1.0000 g | Freq: Once | INTRAVENOUS | Status: AC
  Administered 2020-06-20: 1 g via INTRAVENOUS
  Filled 2020-06-20: qty 50

## 2020-06-20 MED ORDER — FENTANYL CITRATE (PF) 100 MCG/2ML IJ SOLN
25.0000 ug | INTRAMUSCULAR | Status: DC | PRN
  Administered 2020-06-20 (×2): 50 ug via INTRAVENOUS

## 2020-06-20 MED ORDER — MIDAZOLAM HCL 2 MG/2ML IJ SOLN
INTRAMUSCULAR | Status: AC
  Filled 2020-06-20: qty 2

## 2020-06-20 MED ORDER — PROPOFOL 10 MG/ML IV BOLUS
INTRAVENOUS | Status: AC
  Filled 2020-06-20: qty 20

## 2020-06-20 MED ORDER — MORPHINE SULFATE (PF) 4 MG/ML IV SOLN
4.0000 mg | Freq: Once | INTRAVENOUS | Status: AC
  Administered 2020-06-20: 4 mg via INTRAVENOUS
  Filled 2020-06-20: qty 1

## 2020-06-20 MED ORDER — OXYCODONE HCL 5 MG/5ML PO SOLN: 5.0000 mg | Freq: Once | ORAL | Status: DC | PRN

## 2020-06-20 MED ORDER — DEXAMETHASONE SODIUM PHOSPHATE 4 MG/ML IJ SOLN
INTRAMUSCULAR | Status: DC | PRN
  Administered 2020-06-20: 5 mg via INTRAVENOUS

## 2020-06-20 MED ORDER — CEPHALEXIN 500 MG PO CAPS: 500.0000 mg | ORAL_CAPSULE | Freq: Four times a day (QID) | ORAL | 0 refills | Status: AC

## 2020-06-20 MED ORDER — CHLORHEXIDINE GLUCONATE 0.12 % MT SOLN
OROMUCOSAL | Status: AC
  Filled 2020-06-20: qty 15

## 2020-06-20 MED ORDER — BUPIVACAINE HCL (PF) 0.25 % IJ SOLN
INTRAMUSCULAR | Status: DC | PRN
  Administered 2020-06-20: 5 mL

## 2020-06-20 SURGICAL SUPPLY — 63 items
BNDG CMPR 9X4 STRL LF SNTH (GAUZE/BANDAGES/DRESSINGS) ×1
BNDG COHESIVE 1X5 TAN STRL LF (GAUZE/BANDAGES/DRESSINGS) ×1 IMPLANT
BNDG CONFORM 2 STRL LF (GAUZE/BANDAGES/DRESSINGS) ×1 IMPLANT
BNDG ELASTIC 3X5.8 VLCR STR LF (GAUZE/BANDAGES/DRESSINGS) ×1 IMPLANT
BNDG ELASTIC 4X5.8 VLCR STR LF (GAUZE/BANDAGES/DRESSINGS) ×1 IMPLANT
BNDG ESMARK 4X9 LF (GAUZE/BANDAGES/DRESSINGS) ×2 IMPLANT
BNDG GAUZE ELAST 4 BULKY (GAUZE/BANDAGES/DRESSINGS) ×2 IMPLANT
CORD BIPOLAR FORCEPS 12FT (ELECTRODE) ×2 IMPLANT
COVER SURGICAL LIGHT HANDLE (MISCELLANEOUS) ×2 IMPLANT
COVER WAND RF STERILE (DRAPES) ×2 IMPLANT
CUFF TOURN SGL QUICK 18X4 (TOURNIQUET CUFF) ×2 IMPLANT
CUFF TOURN SGL QUICK 24 (TOURNIQUET CUFF)
CUFF TRNQT CYL 24X4X16.5-23 (TOURNIQUET CUFF) IMPLANT
DECANTER SPIKE VIAL GLASS SM (MISCELLANEOUS) ×1 IMPLANT
DRAIN PENROSE 1/4X12 LTX STRL (WOUND CARE) IMPLANT
DRAPE SURG 17X23 STRL (DRAPES) ×2 IMPLANT
DRSG ADAPTIC 3X8 NADH LF (GAUZE/BANDAGES/DRESSINGS) ×1 IMPLANT
DRSG EMULSION OIL 3X3 NADH (GAUZE/BANDAGES/DRESSINGS) ×1 IMPLANT
ELECT REM PT RETURN 9FT ADLT (ELECTROSURGICAL)
ELECTRODE REM PT RTRN 9FT ADLT (ELECTROSURGICAL) IMPLANT
GAUZE SPONGE 4X4 12PLY STRL (GAUZE/BANDAGES/DRESSINGS) ×2 IMPLANT
GAUZE SPONGE 4X4 12PLY STRL LF (GAUZE/BANDAGES/DRESSINGS) ×1 IMPLANT
GAUZE XEROFORM 1X8 LF (GAUZE/BANDAGES/DRESSINGS) ×1 IMPLANT
GAUZE XEROFORM 5X9 LF (GAUZE/BANDAGES/DRESSINGS) IMPLANT
GLOVE BIOGEL PI IND STRL 6.5 (GLOVE) IMPLANT
GLOVE BIOGEL PI IND STRL 7.5 (GLOVE) IMPLANT
GLOVE BIOGEL PI IND STRL 8.5 (GLOVE) ×1 IMPLANT
GLOVE BIOGEL PI INDICATOR 6.5 (GLOVE) ×2
GLOVE BIOGEL PI INDICATOR 7.5 (GLOVE) ×1
GLOVE BIOGEL PI INDICATOR 8.5 (GLOVE) ×1
GLOVE SURG ORTHO 8.0 STRL STRW (GLOVE) ×2 IMPLANT
GOWN STRL REUS W/ TWL LRG LVL3 (GOWN DISPOSABLE) ×3 IMPLANT
GOWN STRL REUS W/ TWL XL LVL3 (GOWN DISPOSABLE) ×1 IMPLANT
GOWN STRL REUS W/TWL LRG LVL3 (GOWN DISPOSABLE) ×6
GOWN STRL REUS W/TWL XL LVL3 (GOWN DISPOSABLE) ×2
HANDPIECE INTERPULSE COAX TIP (DISPOSABLE)
KIT BASIN OR (CUSTOM PROCEDURE TRAY) ×2 IMPLANT
KIT TURNOVER KIT B (KITS) ×2 IMPLANT
MANIFOLD NEPTUNE II (INSTRUMENTS) ×2 IMPLANT
NDL HYPO 25GX1X1/2 BEV (NEEDLE) IMPLANT
NEEDLE HYPO 25GX1X1/2 BEV (NEEDLE) IMPLANT
NS IRRIG 1000ML POUR BTL (IV SOLUTION) ×2 IMPLANT
PACK ORTHO EXTREMITY (CUSTOM PROCEDURE TRAY) ×2 IMPLANT
PAD ARMBOARD 7.5X6 YLW CONV (MISCELLANEOUS) ×4 IMPLANT
PAD CAST 4YDX4 CTTN HI CHSV (CAST SUPPLIES) ×1 IMPLANT
PADDING CAST COTTON 4X4 STRL (CAST SUPPLIES) ×2
SET CYSTO W/LG BORE CLAMP LF (SET/KITS/TRAYS/PACK) IMPLANT
SET HNDPC FAN SPRY TIP SCT (DISPOSABLE) IMPLANT
SOAP 2 % CHG 4 OZ (WOUND CARE) ×2 IMPLANT
SPONGE LAP 18X18 RF (DISPOSABLE) ×2 IMPLANT
SPONGE LAP 4X18 RFD (DISPOSABLE) ×2 IMPLANT
SUT ETHILON 4 0 PS 2 18 (SUTURE) ×2 IMPLANT
SUT ETHILON 5 0 P 3 18 (SUTURE)
SUT NYLON ETHILON 5-0 P-3 1X18 (SUTURE) IMPLANT
SWAB COLLECTION DEVICE MRSA (MISCELLANEOUS) ×2 IMPLANT
SWAB CULTURE ESWAB REG 1ML (MISCELLANEOUS) IMPLANT
SYR CONTROL 10ML LL (SYRINGE) IMPLANT
TOWEL GREEN STERILE (TOWEL DISPOSABLE) ×2 IMPLANT
TOWEL GREEN STERILE FF (TOWEL DISPOSABLE) ×2 IMPLANT
TUBE CONNECTING 12X1/4 (SUCTIONS) ×2 IMPLANT
UNDERPAD 30X36 HEAVY ABSORB (UNDERPADS AND DIAPERS) ×2 IMPLANT
WATER STERILE IRR 1000ML POUR (IV SOLUTION) ×2 IMPLANT
YANKAUER SUCT BULB TIP NO VENT (SUCTIONS) ×2 IMPLANT

## 2020-06-20 NOTE — ED Provider Notes (Signed)
[facility] EMERGENCY DEPARTMENT Provider Note   CSN: [id number] Arrival date & time: 06/20/20  1538     History Chief Complaint  Patient presents with  . Hand Injury    Erik Kelley is a 39 y.o. male.  The history is provided by the patient.  Hand Injury Location:  Finger Finger location:  L thumb Injury: yes   Time since incident:  1 hour Mechanism of injury: amputation   Mechanism of injury comment:  Patient was using a table saw and board slipped causing the table saw to cut off the end of his left thumb Amputation:    Extent:  Complete   Cause:  Power tool   Body part recovered: no   Pain details:    Quality:  Aching, shooting, throbbing and sharp   Severity:  Severe   Onset quality:  Sudden   Timing:  Constant   Progression:  Unchanged Handedness:  Right-handed Foreign body present:  No foreign bodies Tetanus status:  Out of date Prior injury to area:  No Relieved by:  Elevation and immobilization Worsened by:  Movement Ineffective treatments:  None tried Associated symptoms comment:  Denies any other injury Risk factors comment:  No known medical problems      Past Medical History:  Diagnosis Date  . Chronic back pain   . Neck pain     Patient Active Problem List   Diagnosis Date Noted  . Lisfranc dislocation 03/12/2015    Past Surgical History:  Procedure Laterality Date  . OPEN REDUCTION INTERNAL FIXATION (ORIF) DISTAL RADIAL FRACTURE Right 03/12/2015   Procedure: OPEN REDUCTION INTERNAL FIXATION (ORIF) RIGHT DISTAL RADIAL AND SCAPHOID FRACTURES AND CARPAL TUNNEL RELEASE, LEFT FOOT SPLINT  APPLICATION.;  Surgeon: Dairl Ponder, MD;  Location: [facility] OR;  Service: Orthopedics;  Laterality: Right;  . OPEN REDUCTION INTERNAL FIXATION (ORIF) FOOT LISFRANC FRACTURE Left 03/15/2015   Procedure: OPEN REDUCTION INTERNAL FIXATION (ORIF) FOOT LISFRANC FRACTURE; LEFT;  Surgeon: Nadara Mustard, MD;  Location: [facility] OR;  Service: Orthopedics;   Laterality: Left;       No family history on file.  Social History   Tobacco Use  . Smoking status: Current Every Day Smoker    Types: Cigarettes  . Smokeless tobacco: Never Used  Substance Use Topics  . Alcohol use: Yes    Comment: "socially"   . Drug use: No    Home Medications Prior to Admission medications   Medication Sig Start Date End Date Taking? Authorizing Provider  methocarbamol (ROBAXIN) 500 MG tablet Take 1 tablet (500 mg total) by mouth 3 (three) times daily. 03/17/15   Nadara Mustard, MD  oxyCODONE-acetaminophen (ROXICET) 5-325 MG per tablet Take 1 tablet by mouth every 4 (four) hours as needed for severe pain. 03/17/15   Nadara Mustard, MD    Allergies    Patient has no known allergies.  Review of Systems   Review of Systems  All other systems reviewed and are negative.   Physical Exam Updated Vital Signs BP 133/90 (BP Location: Right Arm)   Pulse (!) 111   Temp 98 F (36.7 C) (Oral)   Resp 20   Ht 5\' 8"  (1.727 m)   Wt 74.8 kg   SpO2 100%   BMI 25.09 kg/m   Physical Exam Vitals and nursing note reviewed.  Constitutional:      General: He is not in acute distress.    Appearance: Normal appearance. He is normal weight.  HENT:  Head: Normocephalic.  Eyes:     Pupils: Pupils are equal, round, and reactive to light.  Cardiovascular:     Rate and Rhythm: Tachycardia present.     Pulses: Normal pulses.  Pulmonary:     Effort: Pulmonary effort is normal.    Musculoskeletal:       Hands:  Skin:    General: Skin is warm and dry.     Capillary Refill: Capillary refill takes less than 2 seconds.  Neurological:     General: No focal deficit present.     Mental Status: He is alert and oriented to person, place, and time. Mental status is at baseline.  Psychiatric:        Mood and Affect: Mood normal.        Behavior: Behavior normal.         ED Results / Procedures / Treatments   Labs (all labs ordered are listed, but only  abnormal results are displayed) Labs Reviewed  RESP PANEL BY RT-PCR (FLU A&B, COVID) [id number]    EKG None  Radiology DG Finger Thumb Left  Result Date: 06/20/2020 CLINICAL DATA:  Amputation from table saw. EXAM: LEFT THUMB 2+V COMPARISON:  None. FINDINGS: Severely comminuted and displaced fracture is seen involving the proximal portion of the first distal phalanx. Large soft tissue laceration is noted consistent with saw injury. No radiopaque foreign body is noted IMPRESSION: Severely comminuted and displaced first distal phalangeal fracture. Large soft tissue laceration is noted consistent with saw injury. Electronically Signed   By: Lupita Raider M.D.   On: 06/20/2020 16:26    Procedures Procedures (including critical care time)  Medications Ordered in ED Medications  morphine 4 MG/ML injection 4 mg (has no administration in time range)  ondansetron (ZOFRAN) injection 4 mg (has no administration in time range)  Tdap (BOOSTRIX) injection 0.5 mL (has no administration in time range)  ceFAZolin (ANCEF) IVPB 1 g/50 mL premix (has no administration in time range)    ED Course  I have reviewed the triage vital signs and the nursing notes.  Pertinent labs & imaging results that were available during my care of the patient were reviewed by me and considered in my medical decision making (see chart for details).    MDM Rules/Calculators/A&P                           Patient is a 39 year old male presenting today with partial amputation of his thumb from a table saw.  Patient is right-handed and it is on his left hand.  Bone exposed and PIP joint exposed.  Tetanus updated, Covid swab pending.  Patient given Ancef 10 pain control.  Plain films were pending.  Will discuss with hand surgery.  5:17 PM Imaging consistent with severely comminuted and displaced first distal phalanx fracture with large soft tissue laceration noted.  MDM Number of Diagnoses or Management Options   Amount  and/or Complexity of Data Reviewed Tests in the radiology section of CPT: ordered and reviewed Discuss the patient with other providers: yes Independent visualization of images, tracings, or specimens: yes  Risk of Complications, Morbidity, and/or Mortality Presenting problems: high Diagnostic procedures: moderate Management options: moderate  Patient Progress Patient progress: stable   Final Clinical Impression(s) / ED Diagnoses Final diagnoses:  Partial traumatic amputation of thumb through metacarpophalangeal (MCP) joint, initial encounter    Rx / DC Orders ED Discharge Orders    None  Gwyneth Sprout, MD 06/20/20 1718

## 2020-06-20 NOTE — Discharge Instructions (Signed)
KEEP BANDAGE CLEAN AND DRY CALL OFFICE FOR F/U APPT [phone] IN 8 DAYS RX SENT TO [facility] KEEP HAND ELEVATED ABOVE HEART OK TO APPLY ICE TO OPERATIVE AREA CONTACT OFFICE IF ANY WORSENING PAIN OR CONCERNS.

## 2020-06-20 NOTE — Op Note (Signed)
PREOPERATIVE DIAGNOSIS: Left thumb mangled thumb with open fracture dislocation of the thumb interphalangeal joint with soft tissue disarray from a table saw  POSTOPERATIVE DIAGNOSIS: Same  ATTENDING SURGEON: Dr. Bradly Bienenstock who scrubbed and present for the entire procedure  ASSISTANT SURGEON: None  ANESTHESIA: General via LMA  OPERATIVE PROCEDURE: #1: Open debridement of skin subcutaneous tissue and bone associated with open fracture left thumb distal phalanx and interphalangeal joint #2: Revision amputation left thumb with advancement flap closure  IMPLANTS: None  RADIOGRAPHIC INTERPRETATION: None  SURGICAL INDICATIONS: Patient is a right-hand-dominant gentleman who sustained the injury to his nondominant left thumb.  The patient had the significant soft tissue disarray and near complete amputation to the thumb distal tuft.  Patient was recommended undergo the above procedure.  The risks of surgery include but not limited to bleeding infection damage nearby nerves arteries or tendons loss of motion of the wrist and digits incomplete relief of symptoms and need for further surgical intervention.  Long discussion was carried out with the patient about the potential loss of the thumb to the interphalangeal joint  SURGICAL TECHNIQUE: Patient was palpated via the preoperative holding area marked apart a marker made the left thumb indicate correct operative site.  Patient brought back operating placed supine on anesthesia table where the general anesthetic was administered.  Patient tolerates well.  A well-padded tourniquet placed on the left brachium and stay with the appropriate drape.  The left upper extremities then prepped and draped normal sterile fashion.  A timeout was called the correct site identified procedure then begun.  The patient had the dysvascular thumb tip from the middle portion of the distal phalanx distally.  The patient had a very very small skin bridge that was still present.   The patient has significant soft tissue mangling and complete disarray of the thumb interphalangeal joint.  Multiple fragments of the thumb IP joint were then were excised.  Multiple loose fragments of the IP joint in particular distal phalanx were then removed.  There was not really any bone to reconstruct.  Given the significant injury to the joint and the soft tissue injury decision was made to amputate through the level of the interphalangeal joint.  The nail plate and nailbed were then excised.  The wound was then thoroughly irrigated.  The FPL was of terrible quality.  It was then brought distally resected and allowed to retract proximally.  The wound was then thoroughly irrigated.  After thorough wound irrigation the patient did have a relatively good volar flap.  This volar flap was then slightly advanced and allowed for advancement flap closure with simple Prolene sutures.  Open debridement of the fracture had been carried out with sharp knife's scissors and rongeur.  The advancement flap closure obtain a good closure with good soft tissue over the thumb proximal phalanx.  The tourniquet was had been insufflated was deflated with good perfusion of the thumb tip.  5 cc quarter percent Marcaine infiltrated.  Adaptic dressing sterile compressive bandage then applied.  The patient tolerated the procedure well.  Patient is extubated taken recovery in good condition.  POSTOPERATIVE PLAN: Patient discharged to home.  See him back in the office in approximately 8 to 10 days for wound check x-rays.  Local dressing care will be initiated.  I plan to see him back at the 3-week mark for wound check and suture removal.  May consider coordinating a to protector splint.

## 2020-06-20 NOTE — Progress Notes (Signed)
Discharge instructions reviewed with patient's girlfriend, Shanda Bumps, and with patient. Copy of instructions given to [facility]. She will be transporting patient home and staying with him for at least the next 24 houurs. She and patient verbalized understanding of all instructions including new medications, follow up appointment with surgeon, and when to call MD and when to seek immediate medical attention. Pt discharged alert and oriented,pain controlled.

## 2020-06-20 NOTE — Anesthesia Procedure Notes (Signed)
Procedure Name: Intubation Date/Time: 06/20/2020 8:16 PM Performed by: Mayer Camel, CRNA Pre-anesthesia Checklist: Patient identified, Emergency Drugs available, Suction available and Patient being monitored Patient Re-evaluated:Patient Re-evaluated prior to induction Oxygen Delivery Method: Circle System Utilized Preoxygenation: Pre-oxygenation with 100% oxygen Induction Type: IV induction, Rapid sequence and Cricoid Pressure applied Laryngoscope Size: Miller and 2 Grade View: Grade I Tube type: Oral Number of attempts: 1 Airway Equipment and Method: Stylet and Oral airway Placement Confirmation: ETT inserted through vocal cords under direct vision,  positive ETCO2 and breath sounds checked- equal and bilateral Secured at: 22 cm Tube secured with: Tape Dental Injury: Teeth and Oropharynx as per pre-operative assessment

## 2020-06-20 NOTE — ED Triage Notes (Signed)
Pt present w/ c/o traumatic amputation affect the distal joint of the left thumb. Pt states he was using a table saw when his hand slipped under the blade.   Bleeding controlled.

## 2020-06-20 NOTE — Transfer of Care (Signed)
Immediate Anesthesia Transfer of Care Note  Patient: Erik Kelley  Procedure(s) Performed: IRRIGATION AND DEBRIDEMENT LEFT THUMB, REVISION AMPUTATION (Left Thumb)  Patient Location: PACU  Anesthesia Type:General  Level of Consciousness: awake, alert  and oriented  Airway & Oxygen Therapy: Patient Spontanous Breathing and Patient connected to face mask oxygen  Post-op Assessment: Report given to RN and Post -op Vital signs reviewed and stable  Post vital signs: Reviewed and stable  Last Vitals:  Vitals Value Taken Time  BP 112/74   Temp    Pulse 77   Resp 12   SpO2 96%     Last Pain:  Vitals:   06/20/20 2100  TempSrc:   PainSc: (P) Asleep         Complications: No complications documented.

## 2020-06-20 NOTE — H&P (Signed)
Erik Kelley is an 39 y.o. male.   Chief Complaint: Left thumb table saw injury HPI: The history is provided by the patient.  Hand Injury Location:  Finger Finger location:  L thumb Injury: yes   Time since incident:  1 hour Mechanism of injury: amputation   Mechanism of injury comment:  Patient was using a table saw and board slipped causing the table saw to cut off the end of his left thumb Amputation:    Extent:  Complete   Cause:  Power tool   Body part recovered: no   Pain details:    Quality:  Aching, shooting, throbbing and sharp   Severity:  Severe   Onset quality:  Sudden   Timing:  Constant   Progression:  Unchanged Handedness:  Right-handed Foreign body present:  No foreign bodies Tetanus status:  Out of date Prior injury to area:  No Relieved by:  Elevation and immobilization Worsened by:  Movement Ineffective treatments:  None tried Associated symptoms comment:  Denies any other injury Risk factors comment:  No known medical problems  Past Medical History:  Diagnosis Date  . Chronic back pain   . Neck pain     Past Surgical History:  Procedure Laterality Date  . OPEN REDUCTION INTERNAL FIXATION (ORIF) DISTAL RADIAL FRACTURE Right 03/12/2015   Procedure: OPEN REDUCTION INTERNAL FIXATION (ORIF) RIGHT DISTAL RADIAL AND SCAPHOID FRACTURES AND CARPAL TUNNEL RELEASE, LEFT FOOT SPLINT  APPLICATION.;  Surgeon: Dairl Ponder, MD;  Location: [facility] OR;  Service: Orthopedics;  Laterality: Right;  . OPEN REDUCTION INTERNAL FIXATION (ORIF) FOOT LISFRANC FRACTURE Left 03/15/2015   Procedure: OPEN REDUCTION INTERNAL FIXATION (ORIF) FOOT LISFRANC FRACTURE; LEFT;  Surgeon: Nadara Mustard, MD;  Location: [facility] OR;  Service: Orthopedics;  Laterality: Left;    History reviewed. No pertinent family history. Social History:  reports that he has been smoking cigarettes. He has never used smokeless tobacco. He reports current alcohol use. He reports that he does not use  drugs.  Allergies: No Known Allergies  Medications Prior to Admission  Medication Sig Dispense Refill  . Buprenorphine HCl-Naloxone HCl (ZUBSOLV) 8.6-2.1 MG SUBL Place 1 tablet under the tongue 2 (two) times daily.    Marland Kitchen buPROPion (WELLBUTRIN XL) 150 MG 24 hr tablet Take 150 mg by mouth daily.      Results for orders placed or performed during the hospital encounter of 06/20/20 (from the past 48 hour(s))  Resp Panel by RT-PCR (Flu A&B, Covid) Nasopharyngeal Swab     Status: None   Collection Time: 06/20/20  4:40 PM   Specimen: Nasopharyngeal Swab; Nasopharyngeal(NP) swabs in vial transport medium  Result Value Ref Range   SARS Coronavirus 2 by RT PCR NEGATIVE NEGATIVE    Comment: (NOTE) SARS-CoV-2 target nucleic acids are NOT DETECTED.  The SARS-CoV-2 RNA is generally detectable in upper respiratory specimens during the acute phase of infection. The lowest concentration of SARS-CoV-2 viral copies this assay can detect is 138 copies/mL. A negative result does not preclude SARS-Cov-2 infection and should not be used as the sole basis for treatment or other patient management decisions. A negative result may occur with  improper specimen collection/handling, submission of specimen other than nasopharyngeal swab, presence of viral mutation(s) within the areas targeted by this assay, and inadequate number of viral copies(<138 copies/mL). A negative result must be combined with clinical observations, patient history, and epidemiological information. The expected result is Negative.  Fact Sheet for Patients:  BloggerCourse.com  Fact Sheet for Healthcare  Providers:  SeriousBroker.it  This test is no t yet approved or cleared by the [place] and  has been authorized for detection and/or diagnosis of SARS-CoV-2 by FDA under an Emergency Use Authorization (EUA). This EUA will remain  in effect (meaning this test can be used) for  the duration of the COVID-19 declaration under Section 564(b)(1) of the Act, 21 U.S.C.section 360bbb-3(b)(1), unless the authorization is terminated  or revoked sooner.       Influenza A by PCR NEGATIVE NEGATIVE   Influenza B by PCR NEGATIVE NEGATIVE    Comment: (NOTE) The Xpert Xpress SARS-CoV-2/FLU/RSV plus assay is intended as an aid in the diagnosis of influenza from Nasopharyngeal swab specimens and should not be used as a sole basis for treatment. Nasal washings and aspirates are unacceptable for Xpert Xpress SARS-CoV-2/FLU/RSV testing.  Fact Sheet for Patients: BloggerCourse.com  Fact Sheet for Healthcare Providers: SeriousBroker.it  This test is not yet approved or cleared by the [place] FDA and has been authorized for detection and/or diagnosis of SARS-CoV-2 by FDA under an Emergency Use Authorization (EUA). This EUA will remain in effect (meaning this test can be used) for the duration of the COVID-19 declaration under Section 564(b)(1) of the Act, 21 U.S.C. section 360bbb-3(b)(1), unless the authorization is terminated or revoked.  Performed at [facility], [address], [place], [place] [address]    DG Finger Thumb Left  Result Date: 06/20/2020 CLINICAL DATA:  Amputation from table saw. EXAM: LEFT THUMB 2+V COMPARISON:  None. FINDINGS: Severely comminuted and displaced fracture is seen involving the proximal portion of the first distal phalanx. Large soft tissue laceration is noted consistent with saw injury. No radiopaque foreign body is noted IMPRESSION: Severely comminuted and displaced first distal phalangeal fracture. Large soft tissue laceration is noted consistent with saw injury. Electronically Signed   By: Lupita Raider M.D.   On: 06/20/2020 16:26   DG MINI C-ARM IMAGE ONLY  Result Date: 06/20/2020 There is no interpretation for this exam.  This order is for images obtained during a  surgical procedure.  Please See "Surgeries" Tab for more information regarding the procedure.    ROS: NO RECENT ILLNESSES OR HOSPITALIZATIONS  Blood pressure (!) 146/100, pulse 86, temperature 98 F (36.7 C), temperature source Oral, resp. rate 14, height 5\' 8"  (1.727 m), weight 74.8 kg, SpO2 97 %. Physical Exam: General Appearance:  Alert, cooperative, no distress, appears stated age  Head:  Normocephalic, without obvious abnormality, atraumatic  Eyes:  Pupils equal, conjunctiva/corneas clear,         Throat: Lips, mucosa, and tongue normal; teeth and gums normal  Neck: No visible masses     Lungs:   respirations unlabored  Chest Wall:  No tenderness or deformity  Heart:  Regular rate and rhythm,  Abdomen:   Soft, non-tender,         Extremities: LEFT THUMB: MANGLED THUMB WITH EXPOSED IP JOINT, NEAR COMPLETE AMPUTATION THROUGH IP JOINT,  UNABLE TO FLEX THUMB IP JOINT   Pulses: 2+ and symmetric  Skin: Skin color, texture, turgor normal, no rashes or lesions     Neurologic: Normal    Assessment/Plan LEFT THUMB OPEN MANGLED TABLE SAW INJURY WITH DYSVASCULAR THUMB TIP  R/B/A DISCUSSED WITH PT IN HOSPITAL.  PT VOICED UNDERSTANDING OF PLAN CONSENT SIGNED DAY OF SURGERY PT SEEN AND EXAMINED PRIOR TO OPERATIVE PROCEDURE/DAY OF SURGERY SITE MARKED. QUESTIONS ANSWERED WILL GO HOME FOLLOWING SURGERY  WE ARE PLANNING SURGERY FOR  YOUR UPPER EXTREMITY. THE RISKS AND BENEFITS OF SURGERY INCLUDE BUT NOT LIMITED TO BLEEDING INFECTION, DAMAGE TO NEARBY NERVES ARTERIES TENDONS, FAILURE OF SURGERY TO ACCOMPLISH ITS INTENDED GOALS, PERSISTENT SYMPTOMS AND NEED FOR FURTHER SURGICAL INTERVENTION. WITH THIS IN MIND WE WILL PROCEED. I HAVE DISCUSSED WITH THE PATIENT THE PRE AND POSTOPERATIVE REGIMEN AND THE DOS AND DON'TS. PT VOICED UNDERSTANDING AND INFORMED CONSENT SIGNED.  Sharma Covert 06/20/2020, 7:53 PM

## 2020-06-20 NOTE — Anesthesia Preprocedure Evaluation (Signed)
Anesthesia Evaluation  Patient identified by MRN, date of birth, ID band Patient awake    Reviewed: Allergy & Precautions, H&P , NPO status , Patient's Chart, lab work & pertinent test results  Airway Mallampati: II   Neck ROM: full    Dental   Pulmonary Current Smoker,    breath sounds clear to auscultation       Cardiovascular negative cardio ROS   Rhythm:regular Rate:Normal     Neuro/Psych    GI/Hepatic   Endo/Other    Renal/GU      Musculoskeletal   Abdominal   Peds  Hematology   Anesthesia Other Findings   Reproductive/Obstetrics                             Anesthesia Physical Anesthesia Plan  ASA: II  Anesthesia Plan: General   Post-op Pain Management:    Induction: Intravenous, Rapid sequence and Cricoid pressure planned  PONV Risk Score and Plan: 1 and Ondansetron, Dexamethasone, Midazolam and Treatment may vary due to age or medical condition  Airway Management Planned: Oral ETT  Additional Equipment:   Intra-op Plan:   Post-operative Plan: Extubation in OR  Informed Consent: I have reviewed the patients History and Physical, chart, labs and discussed the procedure including the risks, benefits and alternatives for the proposed anesthesia with the patient or authorized representative who has indicated his/her understanding and acceptance.       Plan Discussed with: CRNA, Anesthesiologist and Surgeon  Anesthesia Plan Comments:         Anesthesia Quick Evaluation

## 2020-06-20 NOTE — ED Notes (Signed)
Left hand redressed and ice applied. Pt resting in bed with family at bedside

## 2020-06-20 NOTE — Progress Notes (Signed)
Called from ED regarding thumb near amputation at left IP joint.  I am covering briefly for Dr. Melvyn Novas who will plan for definitive operative management this evening.  NPO.  IV ancef.  Sterile dressing for now.   Eulas Post, MD

## 2020-06-21 ENCOUNTER — Encounter (HOSPITAL_COMMUNITY): Payer: Self-pay | Admitting: Orthopedic Surgery

## 2020-06-22 NOTE — Anesthesia Postprocedure Evaluation (Signed)
Anesthesia Post Note  Patient: Erik Kelley  Procedure(s) Performed: IRRIGATION AND DEBRIDEMENT LEFT THUMB, REVISION AMPUTATION (Left Thumb)     Patient location during evaluation: PACU Anesthesia Type: General Level of consciousness: awake and alert Pain management: pain level controlled Vital Signs Assessment: post-procedure vital signs reviewed and stable Respiratory status: spontaneous breathing, nonlabored ventilation, respiratory function stable and patient connected to nasal cannula oxygen Cardiovascular status: blood pressure returned to baseline and stable Postop Assessment: no apparent nausea or vomiting Anesthetic complications: no   No complications documented.  Last Vitals:  Vitals:   06/20/20 2130 06/20/20 2145  BP: 136/86 (!) 133/96  Pulse: 79 68  Resp: 13 12  Temp:  36.6 C  SpO2: 100% 98%    Last Pain:  Vitals:   06/20/20 2145  TempSrc:   PainSc: 6                  [name]

## 2021-01-02 ENCOUNTER — Other Ambulatory Visit: Payer: Self-pay | Admitting: Chiropractic Medicine

## 2021-01-02 ENCOUNTER — Other Ambulatory Visit: Payer: Self-pay

## 2021-01-02 ENCOUNTER — Ambulatory Visit
Admission: RE | Admit: 2021-01-02 | Discharge: 2021-01-02 | Disposition: A | Discharge status: Home / Self Care | Payer: BLUE CROSS/BLUE SHIELD | Source: Ambulatory Visit | Attending: Chiropractic Medicine | Admitting: Chiropractic Medicine

## 2021-01-02 DIAGNOSIS — R52 Pain, unspecified: Secondary | ICD-10-CM

## 2021-08-27 IMAGING — CR DG LUMBAR SPINE COMPLETE 4+V
6 series · 6 of 6 positions shown · non-contrast
Comparison: 05/15/2007

CLINICAL DATA: Back pain beginning 6 days ago.  No injury.

EXAM:
LUMBAR SPINE - COMPLETE 4+ VIEW

[w lumbar spine ap]
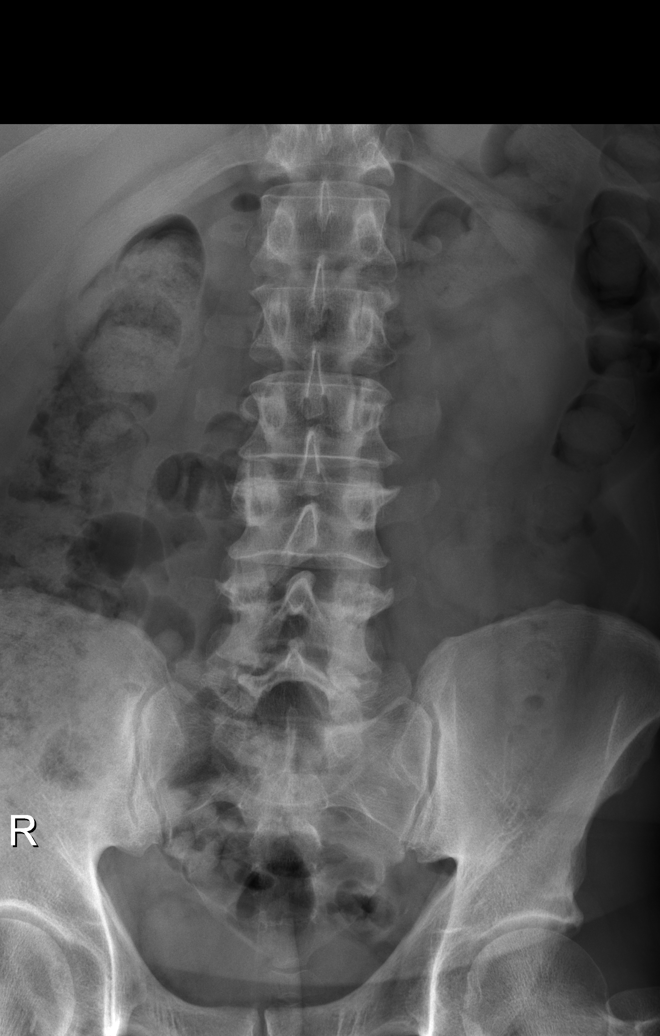

[w lumbar spine obl (1 of 3)]
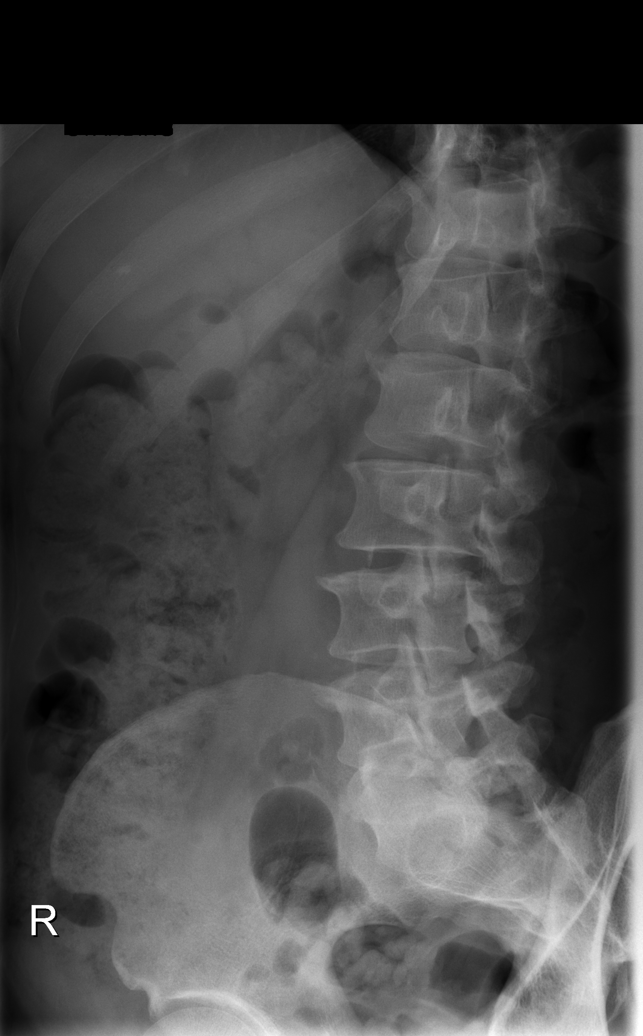

[w lumbar spine obl (2 of 3)]
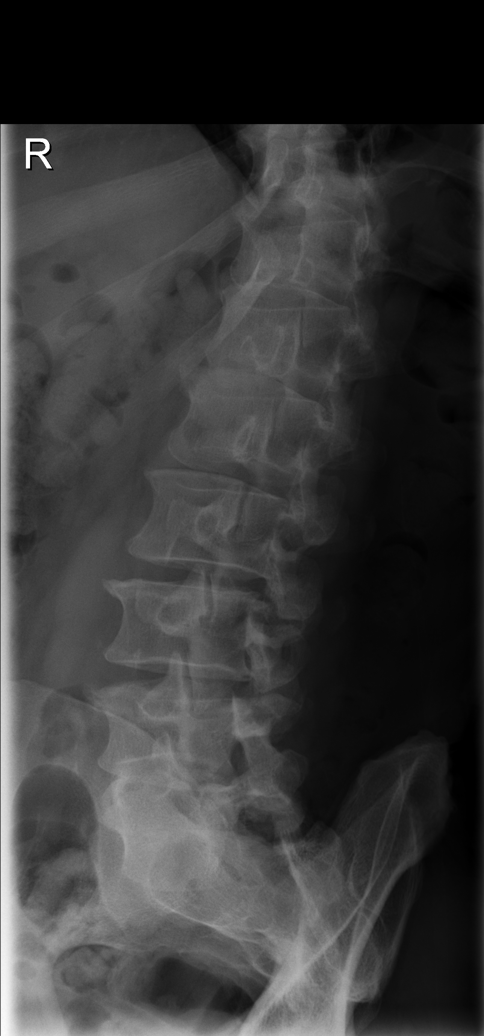

[w lumbar spine obl (3 of 3)]
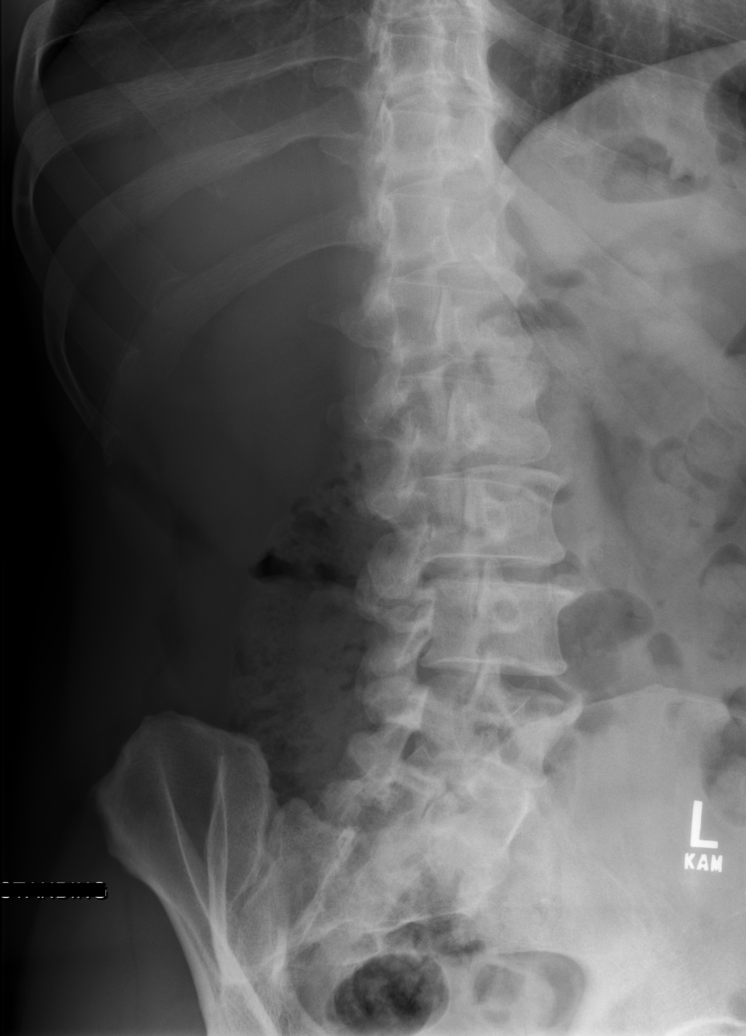

[w lumbar spine lat]
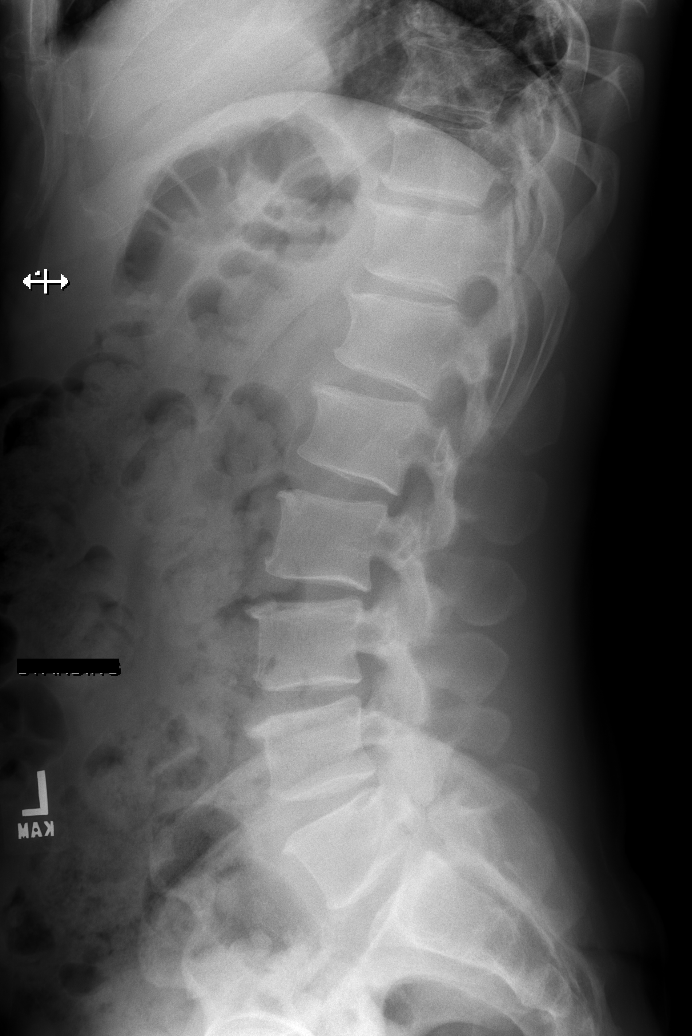

[w lumbar l-5 s-1 spot]
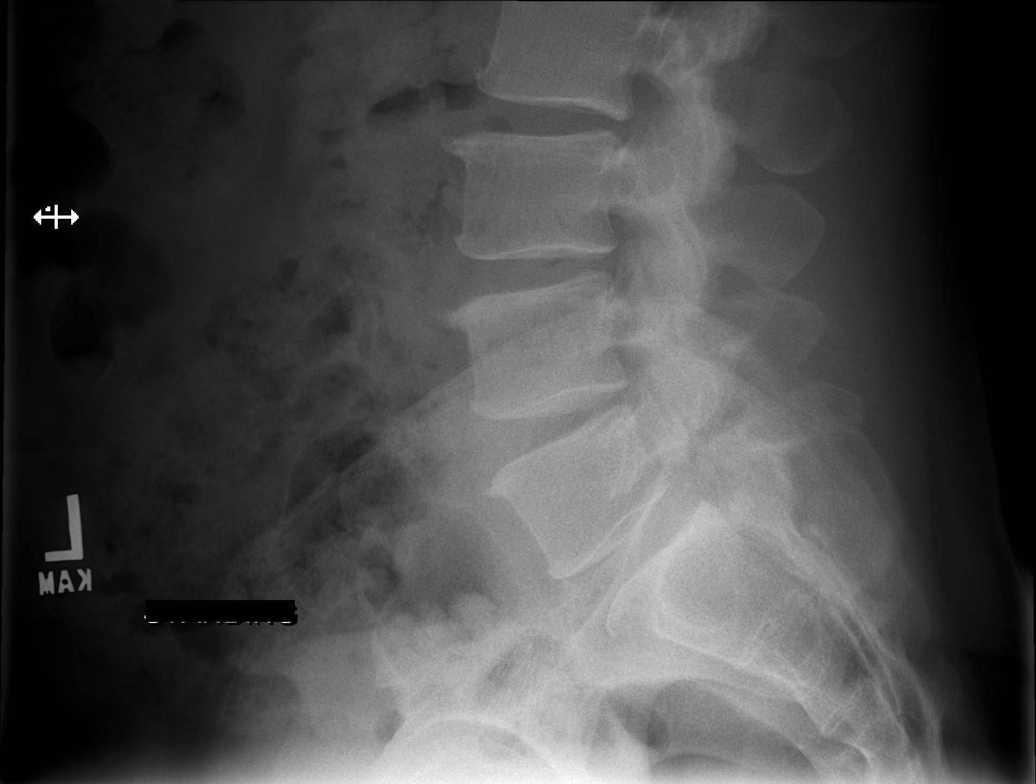

[6 of 6 positions shown; findings below may reference images not displayed]

FINDINGS: Five lumbar type vertebral bodies. Normal alignment of the vertebral
bodies and facet joints. No vertebral compression deformities.
Diffuse degenerative change with mildly narrowed interspaces and
endplate osteophyte formation throughout. Degenerative changes in
the facet joints. No focal bone lesions identified.
IMPRESSION: Degenerative changes.  No acute displaced fractures.

## 2021-12-14 ENCOUNTER — Ambulatory Visit (HOSPITAL_BASED_OUTPATIENT_CLINIC_OR_DEPARTMENT_OTHER): Payer: BLUE CROSS/BLUE SHIELD | Admitting: Orthopaedic Surgery
